# Patient Record
Sex: Female | Born: 1965 | Race: White | Hispanic: No | Smoking: Current every day smoker
Health system: Southern US, Community
[De-identification: ages and names within clinical notes are randomized; demographics above are authoritative.]

## PROBLEM LIST (undated history)

## (undated) DIAGNOSIS — J449 Chronic obstructive pulmonary disease, unspecified: Secondary | ICD-10-CM

## (undated) DIAGNOSIS — E119 Type 2 diabetes mellitus without complications: Secondary | ICD-10-CM

## (undated) DIAGNOSIS — F32A Depression, unspecified: Secondary | ICD-10-CM

## (undated) DIAGNOSIS — F329 Major depressive disorder, single episode, unspecified: Secondary | ICD-10-CM

## (undated) DIAGNOSIS — I10 Essential (primary) hypertension: Secondary | ICD-10-CM

## (undated) HISTORY — PX: BLADDER REPAIR: SHX76

---

## 2008-10-14 ENCOUNTER — Emergency Department (HOSPITAL_COMMUNITY): Admission: EM | Admit: 2008-10-14 | Discharge: 2008-10-14 | Payer: Self-pay | Admitting: Emergency Medicine

## 2009-07-13 ENCOUNTER — Encounter: Payer: Self-pay | Admitting: Emergency Medicine

## 2009-07-14 ENCOUNTER — Inpatient Hospital Stay (HOSPITAL_COMMUNITY): Admission: EM | Admit: 2009-07-14 | Discharge: 2009-07-26 | Payer: Self-pay | Admitting: Pulmonary Disease

## 2009-07-14 ENCOUNTER — Ambulatory Visit: Payer: Self-pay | Admitting: Internal Medicine

## 2009-07-26 ENCOUNTER — Inpatient Hospital Stay (HOSPITAL_COMMUNITY): Admission: RE | Admit: 2009-07-26 | Discharge: 2009-08-02 | Payer: Self-pay | Admitting: Psychiatry

## 2009-07-26 ENCOUNTER — Ambulatory Visit: Payer: Self-pay | Admitting: Psychiatry

## 2009-09-08 ENCOUNTER — Inpatient Hospital Stay: Payer: Self-pay | Admitting: Psychiatry

## 2009-09-20 ENCOUNTER — Inpatient Hospital Stay: Payer: Self-pay | Admitting: Internal Medicine

## 2009-09-20 ENCOUNTER — Ambulatory Visit: Payer: Self-pay | Admitting: Internal Medicine

## 2009-09-20 ENCOUNTER — Inpatient Hospital Stay: Payer: Self-pay | Admitting: Psychiatry

## 2010-02-23 ENCOUNTER — Ambulatory Visit (HOSPITAL_COMMUNITY): Admission: RE | Admit: 2010-02-23 | Discharge: 2010-02-23 | Payer: Self-pay | Admitting: Family Medicine

## 2010-03-14 ENCOUNTER — Ambulatory Visit (HOSPITAL_COMMUNITY): Admission: RE | Admit: 2010-03-14 | Discharge: 2010-03-14 | Payer: Self-pay | Admitting: Family Medicine

## 2010-06-28 IMAGING — CT CT ANGIO CHEST
2 of 6 series · 19 of 36 positions shown · IV contrast (omnipaque)
Comparison: Chest radiography same day

CLINICAL DATA: ELEVATED D-DIMER.  TACHYCARDIA.  SHORT OF BREATH.

CT ANGIOGRAPHY CHEST WITH CONTRAST
TECHNIQUE: Multidetector CT imaging of the chest was performed
using the standard protocol during bolus administration of
intravenous contrast. Multiplanar CT image reconstructions
including MIPs were obtained to evaluate the vascular anatomy.
Contrast: 100 ml Omnipaque 350

[Series 8: pulm embolism 1.0 b25f thins · axial · 0.62mm/px · z∈[-279,-48]mm · 18 of 257 slices shown]
[im 13/257  lung]
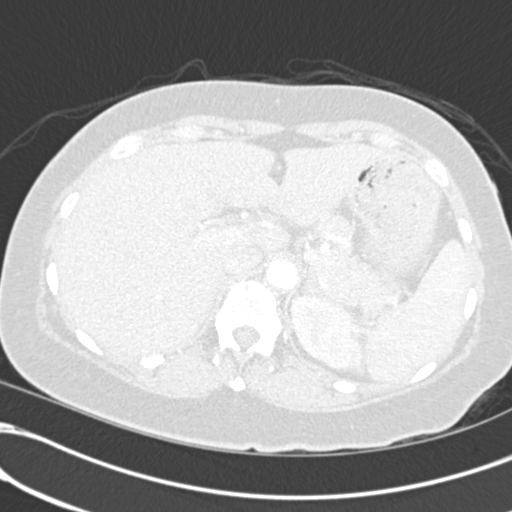
[im 26/257  mediastinal]
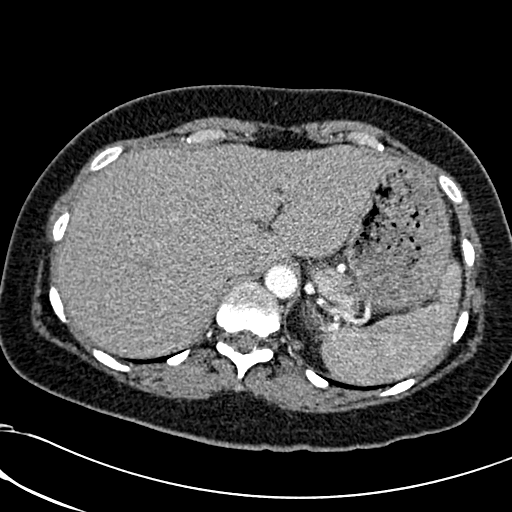
[im 39/257  lung]
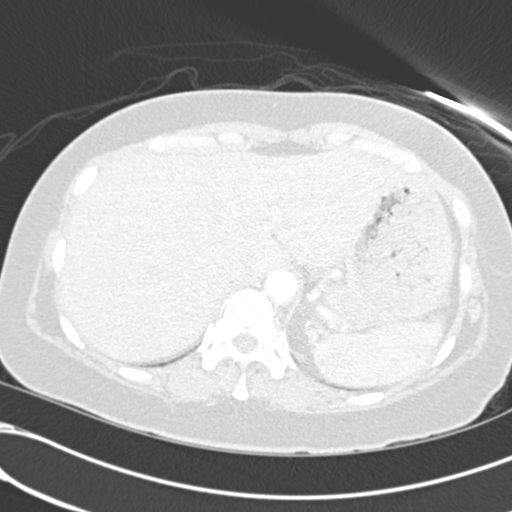
[im 52/257  mediastinal]
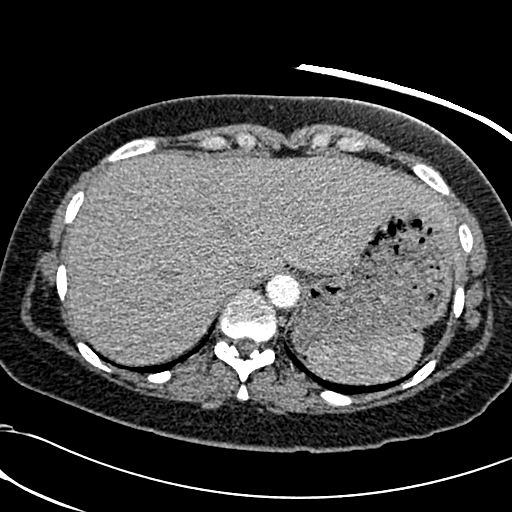
[im 65/257  lung]
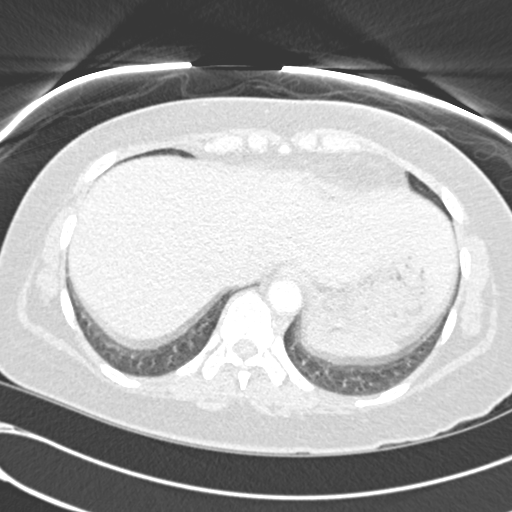
[im 77/257  mediastinal]
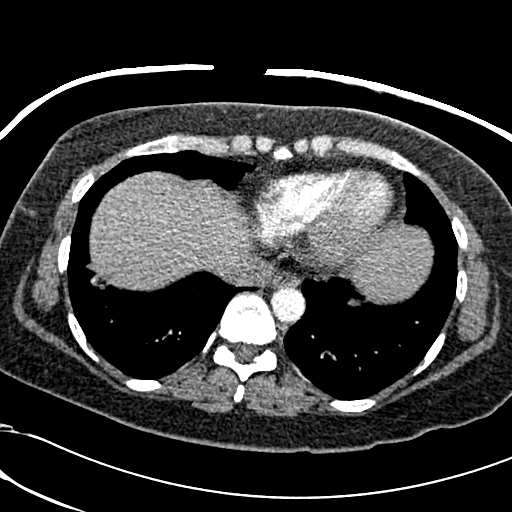
[im 90/257  lung]
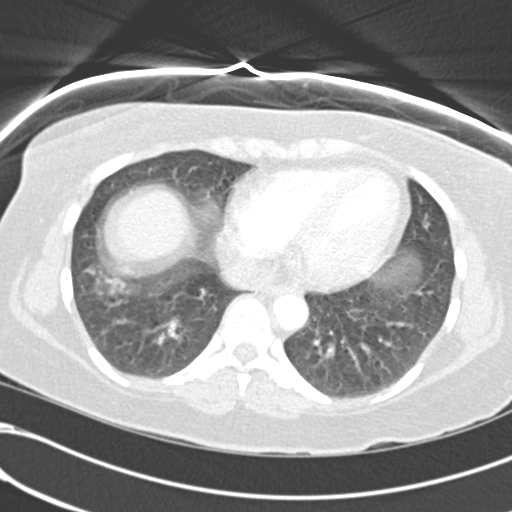
[im 103/257  mediastinal]
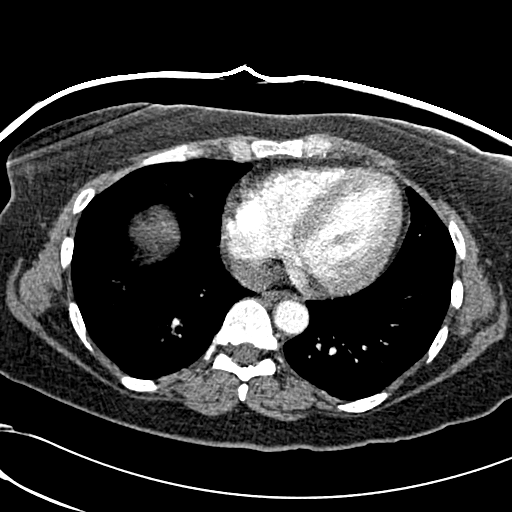
[im 116/257  lung]
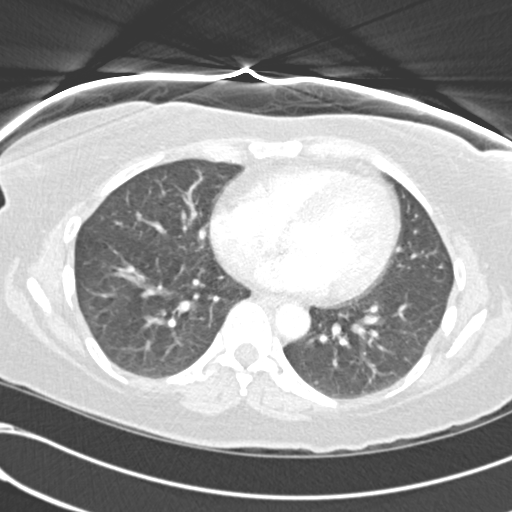
[im 141/257  mediastinal]
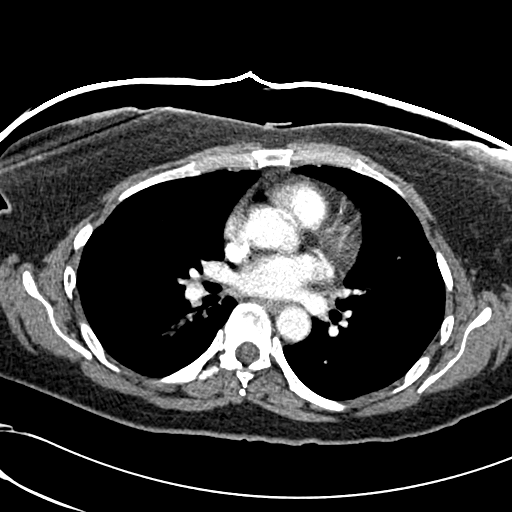
[im 154/257  lung]
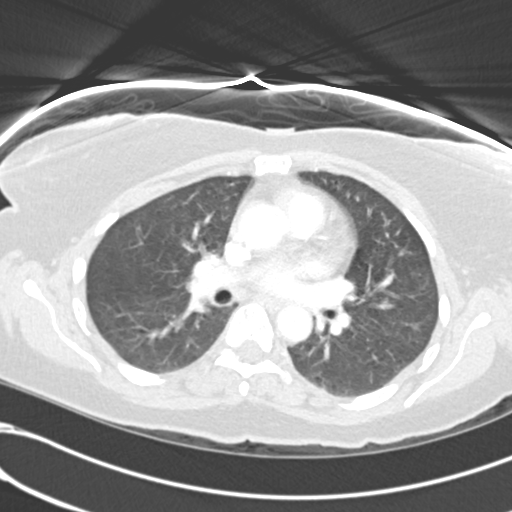
[im 167/257  mediastinal]
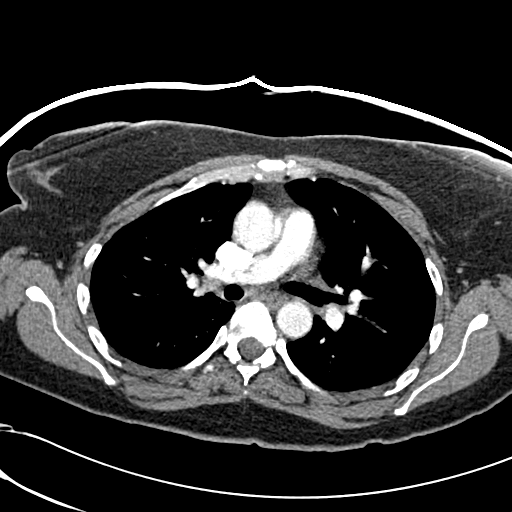
[im 180/257  lung]
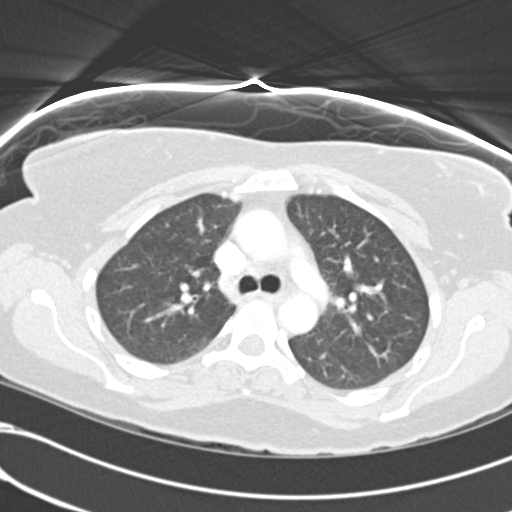
[im 193/257  mediastinal]
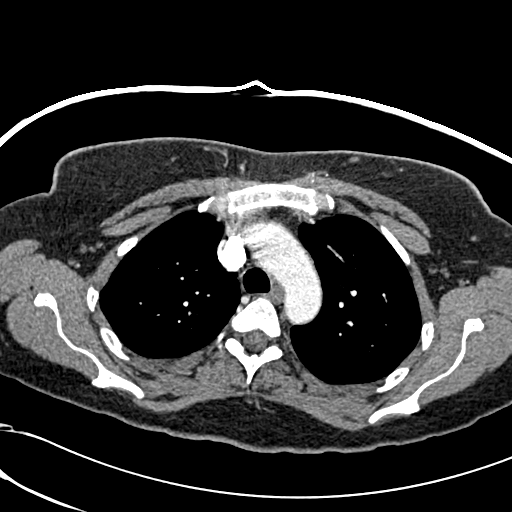
[im 205/257  lung]
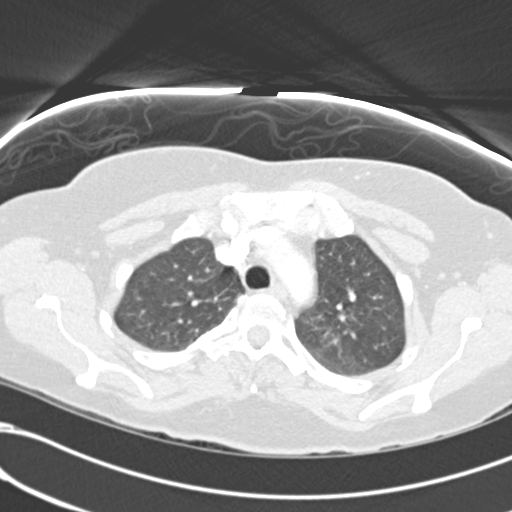
[im 218/257  mediastinal]
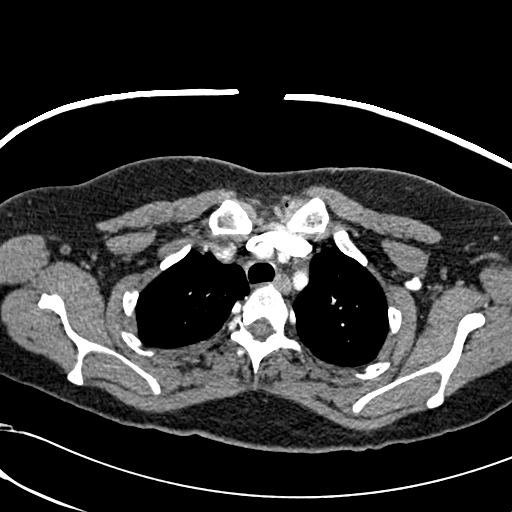
[im 231/257  lung]
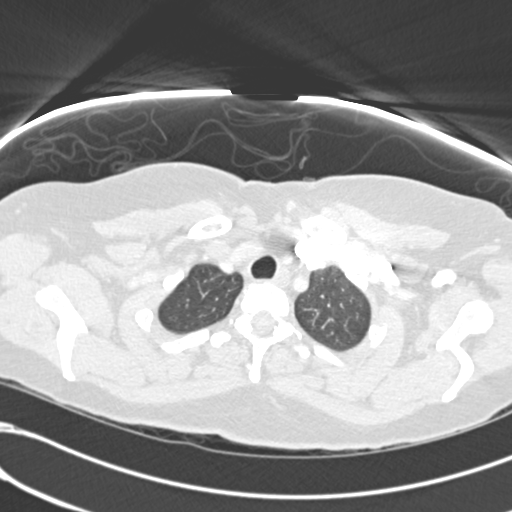
[im 244/257  mediastinal]
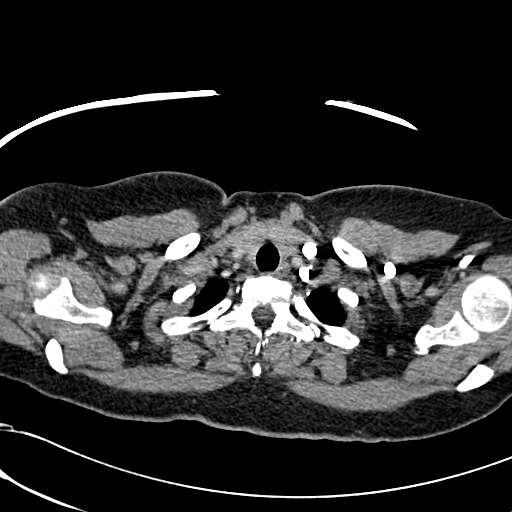

[Series 602: cor · coronal · 0.62mm/px · 1 of 91 slices shown]
[im 46/91  mediastinal]
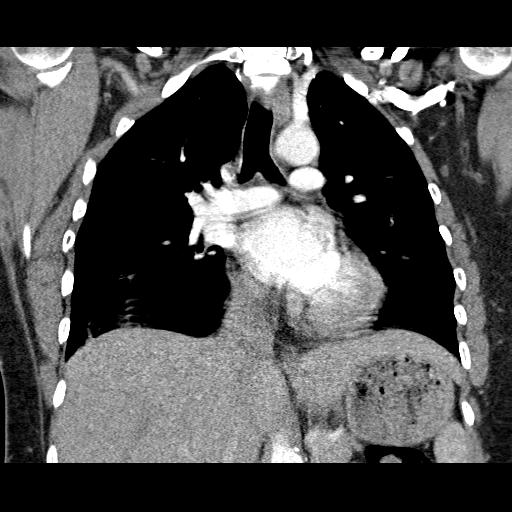

[19 of 36 positions shown; findings below may reference images not displayed]

FINDINGS: Pulmonary arterial opacification is good.  The
examination does suffer from some breathing motion.  The pulmonary
arterial tree does not show any emboli.  There is no aortic
pathology.  The patient does have some coronary artery
calcification evident on this non dedicated exam.  There is no
pleural fluid.  There is mild patchy infiltrate at the right lung
base that could represent early pneumonia.  There is no bony
abnormality of significance.

Review of the MIP images confirms the above findings.
IMPRESSION: Study degraded somewhat by breathing motion.  I believe this is
sufficiently diagnostic.

No sign of pulmonary emboli.

Coronary artery calcification.

Mild patchy infiltrate at the right lung base suggesting early
pneumonia.

## 2010-09-19 ENCOUNTER — Ambulatory Visit (HOSPITAL_COMMUNITY): Admission: RE | Admit: 2010-09-19 | Discharge: 2010-09-19 | Payer: Self-pay | Admitting: Family Medicine

## 2011-01-19 ENCOUNTER — Other Ambulatory Visit (HOSPITAL_COMMUNITY): Payer: Self-pay | Admitting: *Deleted

## 2011-01-19 DIAGNOSIS — Z09 Encounter for follow-up examination after completed treatment for conditions other than malignant neoplasm: Secondary | ICD-10-CM

## 2011-03-27 ENCOUNTER — Ambulatory Visit (HOSPITAL_COMMUNITY)
Admission: RE | Admit: 2011-03-27 | Discharge: 2011-03-27 | Disposition: A | Discharge status: Home / Self Care | Payer: Medicaid Other | Source: Ambulatory Visit | Attending: Family Medicine | Admitting: Family Medicine

## 2011-03-27 ENCOUNTER — Ambulatory Visit (HOSPITAL_COMMUNITY): Payer: Medicaid Other

## 2011-03-27 DIAGNOSIS — R928 Other abnormal and inconclusive findings on diagnostic imaging of breast: Secondary | ICD-10-CM | POA: Insufficient documentation

## 2011-03-27 DIAGNOSIS — Z09 Encounter for follow-up examination after completed treatment for conditions other than malignant neoplasm: Secondary | ICD-10-CM

## 2011-04-07 LAB — VITAMIN B12: Vitamin B-12: 201 pg/mL — ABNORMAL LOW (ref 211–911)

## 2011-04-07 LAB — COMPREHENSIVE METABOLIC PANEL
ALT: 106 U/L — ABNORMAL HIGH (ref 0–35)
ALT: 131 U/L — ABNORMAL HIGH (ref 0–35)
ALT: 140 U/L — ABNORMAL HIGH (ref 0–35)
ALT: 160 U/L — ABNORMAL HIGH (ref 0–35)
ALT: 80 U/L — ABNORMAL HIGH (ref 0–35)
AST: 47 U/L — ABNORMAL HIGH (ref 0–37)
AST: 496 U/L — ABNORMAL HIGH (ref 0–37)
AST: 509 U/L — ABNORMAL HIGH (ref 0–37)
AST: 41 U/L — ABNORMAL HIGH (ref 0–37)
Albumin: 3 g/dL — ABNORMAL LOW (ref 3.5–5.2)
Albumin: 3 g/dL — ABNORMAL LOW (ref 3.5–5.2)
Alkaline Phosphatase: 49 U/L (ref 39–117)
Alkaline Phosphatase: 61 U/L (ref 39–117)
Alkaline Phosphatase: 62 U/L (ref 39–117)
Alkaline Phosphatase: 65 U/L (ref 39–117)
Alkaline Phosphatase: 68 U/L (ref 39–117)
BUN: 3 mg/dL — ABNORMAL LOW (ref 6–23)
BUN: 5 mg/dL — ABNORMAL LOW (ref 6–23)
BUN: 6 mg/dL (ref 6–23)
BUN: <1 mg/dL — ABNORMAL LOW (ref 6–23)
BUN: 2 mg/dL — ABNORMAL LOW (ref 6–23)
CO2: 20 mEq/L (ref 19–32)
CO2: 25 mEq/L (ref 19–32)
CO2: 26 mEq/L (ref 19–32)
CO2: 26 mEq/L (ref 19–32)
CO2: 26 mEq/L (ref 19–32)
CO2: 28 mEq/L (ref 19–32)
CO2: 20 mEq/L (ref 19–32)
Calcium: 8.7 mg/dL (ref 8.4–10.5)
Calcium: 8.7 mg/dL (ref 8.4–10.5)
Calcium: 8.8 mg/dL (ref 8.4–10.5)
Calcium: 9.2 mg/dL (ref 8.4–10.5)
Calcium: 8.3 mg/dL — ABNORMAL LOW (ref 8.4–10.5)
Chloride: 105 mEq/L (ref 96–112)
Chloride: 106 mEq/L (ref 96–112)
Chloride: 108 mEq/L (ref 96–112)
Chloride: 108 mEq/L (ref 96–112)
Chloride: 109 mEq/L (ref 96–112)
Creatinine, Ser: 0.53 mg/dL (ref 0.4–1.2)
Creatinine, Ser: 0.77 mg/dL (ref 0.4–1.2)
Creatinine, Ser: 0.71 mg/dL (ref 0.4–1.2)
GFR calc Af Amer: >60 mL/min (ref >60)
GFR calc Af Amer: >60 mL/min (ref >60)
GFR calc Af Amer: >60 mL/min (ref >60)
GFR calc Af Amer: >60 mL/min (ref >60)
GFR calc non Af Amer: >60 mL/min (ref >60)
GFR calc non Af Amer: >60 mL/min (ref >60)
GFR calc non Af Amer: >60 mL/min (ref >60)
GFR calc non Af Amer: >60 mL/min (ref >60)
GFR calc non Af Amer: >60 mL/min (ref >60)
GFR calc non Af Amer: >60 mL/min (ref >60)
GFR calc non Af Amer: >60 mL/min (ref >60)
Glucose, Bld: 123 mg/dL — ABNORMAL HIGH (ref 70–99)
Glucose, Bld: 124 mg/dL — ABNORMAL HIGH (ref 70–99)
Glucose, Bld: 125 mg/dL — ABNORMAL HIGH (ref 70–99)
Glucose, Bld: 157 mg/dL — ABNORMAL HIGH (ref 70–99)
Glucose, Bld: 180 mg/dL — ABNORMAL HIGH (ref 70–99)
Glucose, Bld: 94 mg/dL (ref 70–99)
Potassium: 3 mEq/L — ABNORMAL LOW (ref 3.5–5.1)
Potassium: 3.6 mEq/L (ref 3.5–5.1)
Potassium: 3.7 mEq/L (ref 3.5–5.1)
Potassium: 3.9 mEq/L (ref 3.5–5.1)
Potassium: 4 mEq/L (ref 3.5–5.1)
Potassium: 4.1 mEq/L (ref 3.5–5.1)
Sodium: 137 mEq/L (ref 135–145)
Sodium: 137 mEq/L (ref 135–145)
Sodium: 139 mEq/L (ref 135–145)
Sodium: 140 mEq/L (ref 135–145)
Sodium: 142 mEq/L (ref 135–145)
Total Bilirubin: 0.3 mg/dL (ref 0.3–1.2)
Total Bilirubin: 0.4 mg/dL (ref 0.3–1.2)
Total Bilirubin: 0.4 mg/dL (ref 0.3–1.2)
Total Bilirubin: 0.5 mg/dL (ref 0.3–1.2)
Total Bilirubin: 0.7 mg/dL (ref 0.3–1.2)
Total Protein: 5.7 g/dL — ABNORMAL LOW (ref 6.0–8.3)
Total Protein: 6.2 g/dL (ref 6.0–8.3)
Total Protein: 6.5 g/dL (ref 6.0–8.3)

## 2011-04-07 LAB — LACTIC ACID, PLASMA
Lactic Acid, Venous: 2.6 mmol/L — ABNORMAL HIGH (ref 0.5–2.2)
Lactic Acid, Venous: 4.3 mmol/L — ABNORMAL HIGH (ref 0.5–2.2)
Lactic Acid, Venous: 5.9 mmol/L — ABNORMAL HIGH (ref 0.5–2.2)

## 2011-04-07 LAB — URINALYSIS, ROUTINE W REFLEX MICROSCOPIC
Bilirubin Urine: NEGATIVE
Glucose, UA: NEGATIVE mg/dL
Ketones, ur: NEGATIVE mg/dL
Leukocytes, UA: NEGATIVE
Nitrite: NEGATIVE
Nitrite: NEGATIVE
Specific Gravity, Urine: 1.003 — ABNORMAL LOW (ref 1.005–1.030)
Specific Gravity, Urine: >1.03 — ABNORMAL HIGH (ref 1.005–1.030)
Urobilinogen, UA: 0.2 mg/dL (ref 0.0–1.0)
Urobilinogen, UA: 0.2 mg/dL (ref 0.0–1.0)
pH: 6 (ref 5.0–8.0)
pH: 5 (ref 5.0–8.0)

## 2011-04-07 LAB — DIFFERENTIAL
Eosinophils Absolute: 0.1 10*3/uL (ref 0.0–0.7)
Eosinophils Absolute: 0 10*3/uL (ref 0.0–0.7)
Eosinophils Relative: 0 % (ref 0–5)
Lymphocytes Relative: 7 % — ABNORMAL LOW (ref 12–46)
Lymphs Abs: 1 10*3/uL (ref 0.7–4.0)
Lymphs Abs: 0.6 10*3/uL — ABNORMAL LOW (ref 0.7–4.0)
Monocytes Relative: 6 % (ref 3–12)
Monocytes Relative: 6 % (ref 3–12)
Neutro Abs: 5.2 10*3/uL (ref 1.7–7.7)
Neutrophils Relative %: 77 % (ref 43–77)

## 2011-04-07 LAB — BLOOD GAS, ARTERIAL
Acid-base deficit: 6.7 mmol/L — ABNORMAL HIGH (ref 0.0–2.0)
Bicarbonate: 17.3 mEq/L — ABNORMAL LOW (ref 20.0–24.0)
FIO2: 70 %
O2 Saturation: 98.7 %
O2 Saturation: 99.7 %
Patient temperature: 98.6
TCO2: 19.6 mmol/L (ref 0–100)
pCO2 arterial: 28.7 mmHg — ABNORMAL LOW (ref 35.0–45.0)
pH, Arterial: 7.48 — ABNORMAL HIGH (ref 7.350–7.400)
pO2, Arterial: 302 mmHg — ABNORMAL HIGH (ref 80.0–100.0)

## 2011-04-07 LAB — BASIC METABOLIC PANEL
BUN: <1 mg/dL — ABNORMAL LOW (ref 6–23)
CO2: 20 mEq/L (ref 19–32)
CO2: 23 mEq/L (ref 19–32)
Chloride: 108 mEq/L (ref 96–112)
Chloride: 112 mEq/L (ref 96–112)
Chloride: 112 mEq/L (ref 96–112)
Creatinine, Ser: 0.52 mg/dL (ref 0.4–1.2)
GFR calc Af Amer: >60 mL/min (ref >60)
GFR calc Af Amer: >60 mL/min (ref >60)
GFR calc non Af Amer: >60 mL/min (ref >60)
Glucose, Bld: 104 mg/dL — ABNORMAL HIGH (ref 70–99)
Potassium: 3.2 mEq/L — ABNORMAL LOW (ref 3.5–5.1)
Potassium: 3.5 mEq/L (ref 3.5–5.1)
Sodium: 141 mEq/L (ref 135–145)

## 2011-04-07 LAB — CBC
HCT: 31.7 % — ABNORMAL LOW (ref 36.0–46.0)
HCT: 36.3 % (ref 36.0–46.0)
HCT: 38.1 % (ref 36.0–46.0)
HCT: 29.9 % — ABNORMAL LOW (ref 36.0–46.0)
Hemoglobin: 10.8 g/dL — ABNORMAL LOW (ref 12.0–15.0)
Hemoglobin: 11 g/dL — ABNORMAL LOW (ref 12.0–15.0)
MCHC: 33.8 g/dL (ref 30.0–36.0)
MCHC: 34.1 g/dL (ref 30.0–36.0)
MCV: 85 fL (ref 78.0–100.0)
MCV: 85.4 fL (ref 78.0–100.0)
MCV: 82.9 fL (ref 78.0–100.0)
Platelets: 242 10*3/uL (ref 150–400)
Platelets: 289 10*3/uL (ref 150–400)
Platelets: 243 10*3/uL (ref 150–400)
RBC: 3.7 MIL/uL — ABNORMAL LOW (ref 3.87–5.11)
RBC: 3.61 MIL/uL — ABNORMAL LOW (ref 3.87–5.11)
RDW: 14.3 % (ref 11.5–15.5)
RDW: 14.6 % (ref 11.5–15.5)
RDW: 14.6 % (ref 11.5–15.5)
WBC: 9.6 10*3/uL (ref 4.0–10.5)
WBC: 9.4 10*3/uL (ref 4.0–10.5)

## 2011-04-07 LAB — PROTIME-INR
INR: 1.1 (ref 0.00–1.49)
INR: 1 (ref 0.00–1.49)
Prothrombin Time: 14.3 seconds (ref 11.6–15.2)
Prothrombin Time: 13.1 seconds (ref 11.6–15.2)
Prothrombin Time: 13.3 seconds (ref 11.6–15.2)

## 2011-04-07 LAB — URINE MICROSCOPIC-ADD ON

## 2011-04-07 LAB — TSH: TSH: 0.136 u[IU]/mL — ABNORMAL LOW (ref 0.350–4.500)

## 2011-04-07 LAB — AMMONIA: Ammonia: 33 umol/L (ref 11–35)

## 2011-04-07 LAB — OSMOLALITY, URINE: Osmolality, Ur: 544 mOsm/kg (ref 390–1090)

## 2011-04-07 LAB — HEPATITIS PANEL, ACUTE
Hep A IgM: NEGATIVE
Hep B C IgM: NEGATIVE

## 2011-04-07 LAB — RAPID URINE DRUG SCREEN, HOSP PERFORMED: Tetrahydrocannabinol: NOT DETECTED

## 2011-04-07 LAB — CULTURE, BLOOD (ROUTINE X 2): Culture: NO GROWTH

## 2011-04-07 LAB — RPR: RPR Ser Ql: NONREACTIVE

## 2011-04-07 LAB — MAGNESIUM
Magnesium: 2 mg/dL (ref 1.5–2.5)
Magnesium: 2.1 mg/dL (ref 1.5–2.5)
Magnesium: 2.1 mg/dL (ref 1.5–2.5)

## 2011-04-07 LAB — PHOSPHORUS
Phosphorus: 3.4 mg/dL (ref 2.3–4.6)
Phosphorus: 5 mg/dL — ABNORMAL HIGH (ref 2.3–4.6)
Phosphorus: <1 mg/dL — CL (ref 2.3–4.6)

## 2011-04-07 LAB — CORTISOL: Cortisol, Plasma: 39.1 ug/dL

## 2011-04-07 LAB — HEPATIC FUNCTION PANEL
Bilirubin, Direct: 0.2 mg/dL (ref 0.0–0.3)
Indirect Bilirubin: 0.5 mg/dL (ref 0.3–0.9)
Total Bilirubin: 0.7 mg/dL (ref 0.3–1.2)

## 2011-04-07 LAB — ACETAMINOPHEN LEVEL
Acetaminophen (Tylenol), Serum: <10 ug/mL — ABNORMAL LOW (ref 10–30)
Acetaminophen (Tylenol), Serum: 10.7 ug/mL (ref 10–30)
Acetaminophen (Tylenol), Serum: 14.7 ug/mL (ref 10–30)

## 2011-04-07 LAB — PREGNANCY, URINE: Preg Test, Ur: NEGATIVE

## 2011-04-07 LAB — T4, FREE: Free T4: 1.31 ng/dL (ref 0.80–1.80)

## 2011-04-07 LAB — URINE CULTURE

## 2011-04-07 LAB — D-DIMER, QUANTITATIVE: D-Dimer, Quant: 1.7 ug/mL-FEU — ABNORMAL HIGH (ref 0.00–0.48)

## 2011-04-07 LAB — CALCIUM, IONIZED: Calcium, Ion: 1.26 mmol/L (ref 1.12–1.32)

## 2011-05-14 NOTE — Consult Note (Signed)
[name], [name]              ACCOUNT NO.:  [id number]   MEDICAL RECORD NO.:  [id number]          PATIENT TYPE:  INP   LOCATION:  3107                         FACILITY:  [facility]   PHYSICIAN:  Antonietta Breach, M.D.  DATE OF BIRTH:  12-09-1966   DATE OF CONSULTATION:  07/14/2009  DATE OF DISCHARGE:                                 CONSULTATION   ADDENDUM   Concur with further evaluating her low TSH.      Antonietta Breach, M.D.  Electronically Signed     JW/MEDQ  D:  07/15/2009  T:  07/15/2009  Job:  [id number]

## 2011-05-14 NOTE — Discharge Summary (Signed)
[name], [name]              ACCOUNT NO.:  [id number]   MEDICAL RECORD NO.:  [id number]          PATIENT TYPE:  INP   LOCATION:  3017                         FACILITY:  [facility]   PHYSICIAN:  Altha Harm, MDDATE OF BIRTH:  02-01-1966   DATE OF ADMISSION:  07/14/2009  DATE OF DISCHARGE:  07/25/2009                               DISCHARGE SUMMARY   DISCHARGE DISPOSITION:  Inpatient Behavioral Health.   DISCHARGE DIAGNOSES:  1. Metabolic encephalopathy.  2. Hyponatremia.  3. Seizures.  4. Possible hypertensive encephalopathy.  5. Bipolar disorder.  6. Suicidal ideation.  7. Transient transaminitis now resolved.  8. Escherichia coli urinary tract infection.  9. Sinus tachycardia.  10.Metabolic acidosis resolved.  11.Acetaminophen toxicity.  12.Schizophrenia with suicidal ideations.  13.Low TSH.  14.Hypokalemia.   DISCHARGE MEDICATIONS:  1. Norvasc 5 mg p.o. daily.  2. Abilify 5 mg p.o. daily.  3. Multivitamin 1 tablet p.o. daily.  4. Metoprolol 50 mg p.o. b.i.d..   CONSULTANTS:  1. Dr. Jeanie Sewer, Psychiatry.  2. [facility].   DIAGNOSTIC STUDIES:  1. CT chest, one-view which shows no active disease.  2. CT head without contrast which shows a very faint hypodensity in      the occipital white matter as suggested.  In the setting of seizure      and hypertension, this could reflect posterior reversible      encephalopathy /hypertensive encephalopathy.  Mild chronic sphenoid      sinusitis.  3. Repeat CT of the head without contrast done on the 18th which shows      no evidence of acute intracranial abnormality.  Impression:  The      occipital white matter hypodensities identified on the previous      study are not well-visualized today.  4. Abdominal ultrasound done on the 19th which shows negative      abdominal ultrasound.  5. MRI of the brain with and without contrast done on the 19th which      shows no acute brain pathology evident.  If the patient  did have      posterior reversible encephalopathy, it has resolved.  There is      some chronic appearing white matter foci in the frontal lobes.  6. Two view chest x-ray done on 20th which shows no acute      cardiopulmonary process.  7. CT angiogram of chest done on the 24th which shows no sign of      pulmonary embolus.  Mild patchy infiltrate at the right lung base      suggesting early pneumonia.    Primary care physician is unassigned.   ALLERGIES:  PENICILLIN.   Code status:  Full code.   CHIEF COMPLAINT:  Unresponsive but protecting airway status post  seizure.   HISTORY OF PRESENT ILLNESS:  Please refer to the report listed  consultation by Dr. Derenda Mis on July 15 for details of the HPI.  Please note that the patient was seen at [facility] by Dr.  Ladona Ridgel who then had her transferred to the critical care medicine  service here at [facility].  But in short, this is a  lady with  a history of schizophrenia who had not seen a physician for many years  who presented to the hospital on the morning of admission complaining of  not feeling well and complaining of headache.  She admits to taking  Tylenol early  in the morning but not taking any medications.  The  patient was getting into the car to be transported to the hospital when  she developed the onset of acute tonic-clonic seizures lasting several  minutes.   HOSPITAL COURSE:  1. Metabolic encephalopathy with hyponatremia.  The patient upon      evaluation is found to be hyponatremic with sodium of 116.  This      was felt to be the etiology of her seizures.  The patient was      treated with IV fluids and given corrective care and her sodium      resolved.  The patient has had no further seizure activity June      hospitalization.  2. Encephalopathy.  The patient had an encephalopathy which was likely      multifocal including metabolic owing to a sodium of 528,      hypertensive, psychiatric.   The patient was treated for      hypertension; in addition her sodium was corrected and her      encephalopathy resolved.  3. Acetaminophen toxicity.  The patient had taken acetaminophen and 4      hours post ingestion her acetaminophen levels were 15.  She was      treated with acetadote, a bolus dose and the acetaminophen level      returned to normal.  4. E-coli urinary tract infection.  The patient had E. coli urinary      tract infection sensitive to ciprofloxacin.  She was fully treated      for this.  5. Possible aspiration pneumonia.  The patient did not have any      clinical signs but on CT angiogram was diagnosed with pneumonia.      The patient had been on ciprofloxacin for UTI and this was also      used to treat her pneumonia.  Presently the patient is without any      symptoms, breathing comfortably without any problems.  6. Schizophrenia, suicidal ideations.  Apparently the patient has a      history of schizophrenia and had suicidal ideations upon arrival to      the hospital.  She continues to have suicidal ideations and      continues to hear voices in her head.  Dr. Jeanie Sewer saw her in      consultation and has recommended the patient be seen in inpatient      Behavioral Health.  The patient presently is on Abilify 5 mg p.o.      daily.  7. Transaminitis.  The patient has transient transaminitis and this      has resolved with supportive care.  8. Hypertension.  The patient as noted above was hypertensive upon      arrival and likely with a  hypertensive encephalopathy.  The      patient is currently on Norvasc and metoprolol 50 mg p.o. b.i.d..   DIETARY RESTRICTIONS:  The patient should be on a 2 gram sodium diet.   PHYSICAL RESTRICTIONS:  None.   FOLLOW UP:  The patient is to be transferred to inpatient Behavioral  Health.  She will  likely need to find a primary care physician upon  discharge to follow her for her hypertension.  Further instructions   regarding psychiatric follow-up will be given at the time of discharge  from inpatient Behavioral Health.   TOTAL DISCHARGE TIME:  42 minutes.      Altha Harm, MD  Electronically Signed     MAM/MEDQ  D:  07/25/2009  T:  07/25/2009  Job:  [id number]

## 2011-05-14 NOTE — Consult Note (Signed)
[name], GRATER NO.:  [id number]   MEDICAL RECORD NO.:  [id number]          PATIENT TYPE:  INP   LOCATION:  3107                         FACILITY:  [facility]   PHYSICIAN:  Antonietta Breach, M.D.  DATE OF BIRTH:  1966/03/13   DATE OF CONSULTATION:  07/14/2009  DATE OF DISCHARGE:                                 CONSULTATION   REQUESTING PHYSICIAN:  [facility].   REASON FOR CONSULTATION:  Psychosis.   HISTORY OF PRESENT ILLNESS:  [name] is a 45 year old female  admitted to the [facility] on July 13, 2009, due to a convulsion and  hyponatremia.  She had initial clouding of consciousness and has now  regained a clear sensorium.  She persists with inappropriate laughter.  She also has a pattern of going mute except that she will use murmuring  in an inflection as if she is making a statement cognitively in her mind  without verbalizing the words.  She describes a chip that is controlling  and has been placed in her body.  She describes thought broadcasting and  paranoia regarding other people in the environment.   Her judgment is clearly impaired.  She states that she has been having  suicidal thoughts.  However, she is vague about when they have been  occurring.   In review of the admitting physician's report the patient was a poor  historian and her brother provided history.  He had stated to Dr. Ladona Ridgel  that the patient had been complaining of not feeling well on the morning  before her convulsion.  She had told her brother not to speak but to rub  her hand from her head down across her chest and abdomen.  She then  turned around and walked away from her brother.  It is suspected that  she went to her neighbor's house and then she made a call to her  brother.  At that time she stated that she was not feeling well and was  having headache.  When her brother went to retrieve her he found her  walking home.  She told her brother that she  had taken some Tylenol in  the morning.  When her brother was trying to take her to the hospital  she developed a tonic-clonic convulsion that lasted several minutes  after which she was unresponsive.  This was followed by some  arousability but then returned back to obtundation and unresponsiveness.   In the emergency room the patient's eyes were disconjugate.  She had  salivation following flowing from her mouth and was not arousable to  tactile or verbal stimuli.  Her Tylenol level was 15 and she was given  Mucomyst.  She awoke from her deep obtundation and became combative.  She also was showing a hypertension with her systolic blood pressure in  the 190s along with tachycardia.  Since that time she has developed a  clear sensorium with the presentation mentioned above.  Please see the  mental status exam.   Possible contributing factors involve self medicating with herbal  medicine.  The brother has  mentioned that she has been using alternative  herbal treatments including colonic cleansers.  Her sodium was 116 and  she did require hypertonic saline upon presentation.   PAST PSYCHIATRIC HISTORY:  [name] does acknowledge that she has had  periods in the past with suicidal ideation.  She denies ever attempting.  She denies psychiatric hospitalization.  However, she acknowledges that  she has seen a psychiatrist at a clinic.  She has been placed on Abilify  in the past for having similar psychotic symptoms as she is experiencing  now.  She denies any side effects from the Abilify and she states that  it did reduce the intensity of her psychotic experience.  It is unclear  how long she stayed on the Abilify.   FAMILY PSYCHIATRIC HISTORY:  None known.   SOCIAL HISTORY:  [name] is divorced.  She is unemployed, medically  disabled.  She lives in a house with her brother.  She does not use  alcohol or illegal drugs.   PAST MEDICAL HISTORY:  Status post convulsion,  hyponatremia.  She did  receive Mucomyst.  Her initial Tylenol level was 15.3 followed by  another one 1 hour later which was 14.7, at the third hour it was 10.7.   OTHER LABORATORY DATA:  Urine drug screen was positive for  benzodiazepines.  Alcohol negative.  HCG negative.  Ammonia normal.  INR  normal.  TSH was low at 0.136.  Hepatic function panel did show elevated  transaminases with SGOT 181, SGPT 39, magnesium normal.  Phosphorus was  low and then corrected.  Head CT on July 15 showed a very faint  hypodensity in the occipital white matter, it was suggested, it was  noted by the radiologist that in the setting of seizure and hypertension  this could reflect posterior reversible encephalopathy/hypertensive  encephalopathy.   MEDICATIONS:  The MAR is reviewed.  She is not currently on any  psychotropic medication other than Ativan 0.5 to 1 mg q.4 h p.r.n.   ALLERGIES:  PENICILLIN.   REVIEW OF SYSTEMS:  The patient can provide some of this.  The rest is  gleaned from the staff and the medical record.  Constitutional, head,  eyes, ears, nose, throat, mouth, neurologic, psychiatric,  cardiovascular, respiratory, gastrointestinal, genitourinary, skin,  musculoskeletal, hematologic, lymphatic, endocrine and metabolic all  unremarkable.   PHYSICAL EXAMINATION:  VITAL SIGNS:  Temperature 99.8, pulse 98,  respiratory rate 20, blood pressure 130/56.  GENERAL APPEARANCE:  [name] is a middle-aged female lying in a  supine position in her hospital bed with no abnormal involuntary  movements.  MENTAL STATUS EXAM:  [name] is alert.  Her eye contact is poor.  She looks toward the door and laughs often.  Her attention span is  mildly decreased.  Her affect involves inappropriate laughter.  She then  demonstrates an intermittent pattern of the murmuring described above in  the history of present illness.  Her mood involves intermittent  inappropriate laughter.  Her concentration is  mildly decreased.  She is  completely oriented to the year, month, day of the month, day of the  week, place and person.  Her memory function is intact to immediate,  recent and remote except for the blackout periods with the convulsions.  Her fund of knowledge and intelligence are grossly within normal limits.  Her speech involves normal articulation when she is not murmuring.  There is no dysarthria when she is not murmuring.  Thought process  involves  some illogia but is overall coherent.  Thought content paranoid  delusions, thought insertion, thought broadcasting.  She states that  many people are observing her and tracking her when this is clearly not  the case.  Her insight is partial in that she does recognize that  Abilify was able to reduce the unpleasant aspect of her experience.  Her  judgment is impaired.   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  Rule out schizophrenia undifferentiated type, chronic  with acute exacerbation.  Depressive disorder not otherwise specified.  She does give a history of suicidal thoughts.  The elevated  transaminases suggests that Tylenol overdosing may have been a liver  insult and that the Tylenol levels obtained were at the tail end of what  was a toxic level.  It may be that the colon cleansing system that she  was using resulted in a hyponatremia.  Her delirium has now resolved.  AXIS II:  Deferred.  AXIS III:  See past medical history.  AXIS IV:  Primary support group, general medical.  AXIS V:  20.   Ms. Stigall clearly is at risk for lethal self-neglect given her  psychosis.  Also it is likely that she has taken a Tylenol overdose in  the past week.  She also mentions that she has had suicidal thoughts but  there is vague history provided by the patient.   RECOMMENDATIONS:  Would admit her to an inpatient psychiatric unit once  she is medically cleared given her risk of lethal  self-neglect and  likely ongoing risk of suicide.  She does agree to start Abilify.  She  has insight that it helped reduce the intensity of her unpleasant  experience.  Would start it at 10 mg p.o. or IM q.1800 hours.  Would  admit to an inpatient psychiatric unit once medically cleared.  Would  provide low stimulation ego support to facilitate a therapeutic  alliance.  With the Abilify would monitor for stiffness or other  extrapyramidal side effects.      Antonietta Breach, M.D.  Electronically Signed     JW/MEDQ  D:  07/15/2009  T:  07/15/2009  Job:  [id number]

## 2011-05-14 NOTE — Procedures (Signed)
EEG NUMBER:  [id number].   HISTORY:  This is a 45 year old patient who is being evaluated for  seizure events.  The patient has a history of hyponatremia, psychosis,  and altered mental status.  The patient has hypertension.  The patient  is being evaluated for the above.  This is a routine EEG.  No skull  defects noted.   MEDICATIONS:  Protonix, Abilify, potassium, Norvasc, acetylcysteine,  Apresoline, Aldomet, and Ativan.   EEG CLASSIFICATION:  Essentially normal awake and drowsy.   DESCRIPTION OF THE RECORDING:  Background rhythm of this recording  consists of a fairly well-modulated medium amplitude alpha rhythm of 8-  Hz that is reactive to eye open and closure.  As the record progresses,  the patient appears initially to be in awaking state.  Photic  stimulation is performed resulting in an excellent and bilaterally  symmetric photic drive response.  Hyperventilation is also performed  resulting in minimal buildup of background activity with mild symmetric  slowing seen.  At times during the recording, the patient appears  entered the drowsy state with some generalized theta frequency slowing  seen, the patient never clearly entered stage II sleep at any time.  At  no time, during the recording there appear to be evidence of spike,  spike wave discharges, or evidence of focal slowing.  EKG monitor shows  no evidence of cardiac rhythm abnormalities with a heart rate of 102.   IMPRESSION:  This is a normal EEG recording in awake and drowsy state.  No evidence of ictal or interictal discharges were seen.      Marlan Palau, M.D.  Electronically Signed     BJY:NWGN  D:  07/17/2009 14:14:31  T:  07/18/2009 03:20:23  Job #:  [id number]

## 2011-05-14 NOTE — Consult Note (Signed)
NAMETIMEA, BREED              ACCOUNT NO.:  [id number]   MEDICAL RECORD NO.:  [id number]          PATIENT TYPE:  INP   LOCATION:  3017                         FACILITY:  [facility]   PHYSICIAN:  Antonietta Breach, M.D.  DATE OF BIRTH:  [birth year]   DATE OF CONSULTATION:  07/25/2009  DATE OF DISCHARGE:                                 CONSULTATION   PRIORITY INPATIENT CONSULTATION FOLLOWUP   [name] continues to have auditory hallucinations.  She continues  with impaired judgment; however, her insight is improved.  She is able  to recognize that she has a mental illness and needs further care.   She has been having intermittent suicidal ideation.  Her mood is  depressed.  She is crying as the undersigned is interviewing her.   MENTAL STATUS EXAM:  As above, [name] is alert.  Her thought  process is coherent.  Her affect is constricted.  Her mood is mildly  depressed.  Her judgment is impaired.  She is still having auditory  hallucinations.  She is now acknowledging that she took the overdose to  kill herself because of her severe mental anguish.  She is oriented to  all spheres, and her memory function is intact.   ASSESSMENT:   AXIS I:  293.82:  Psychotic disorder, not otherwise specified, with  hallucinations.   RECOMMENDATION:  1. Would admit to an inpatient psychiatric unit for further evaluation      and treatment.  2. Would continue the Abilify trial for anti-psychosis.   DISCUSSION:  [name] likely has a diagnosis of schizophrenia.  She  has resisted this diagnosis in the past as well as resisting having any  form of mental illness.   She is expressing some insight now that will help in her ongoing care.      Antonietta Breach, M.D.  Electronically Signed     JW/MEDQ  D:  07/25/2009  T:  07/25/2009  Job:  [id number]

## 2011-05-14 NOTE — Consult Note (Signed)
NAMEJANECE, [name]              ACCOUNT NO.:  [id number]   MEDICAL RECORD NO.:  [id number]          PATIENT TYPE:  EMS   LOCATION:  ED                            FACILITY:  [facility]   PHYSICIAN:  Melissa L. Ladona Ridgel, MD  DATE OF BIRTH:  25-Nov-1966   DATE OF CONSULTATION:  07/13/2009  DATE OF DISCHARGE:                                 CONSULTATION   REASON FOR CONSULTATION:  I was called to see a 45 year old female who  was described as unresponsive but protecting her airway status post  seizure.  Consultation was requested by Rhae Lerner. Margretta Ditty, M.D.   HISTORY OF PRESENT ILLNESS:  The patient's brother states that this  morning she was complaining of not feeling well and all that she would  tell him with regard to not feeling well was not to speak but to rub her  hand from her head down across her chest and abdomen.  She subsequently  turned around and walked away from him and went about her business for  the rest of the day.  She evidently wandered off to the neighbor's house  later in the afternoon and the patient's brother was called stating she  was not feeling well and was complaining of a headache.  The patient's  brother went to bring her back from the neighbor's house, found her  walking home.  The patient had evidently informed her brother that she  had taken some Tylenol earlier this morning, but had taken no other  medications in response to her current illness.  The patient's brother  attempted to put the patient in the car to transport her to the hospital  when she developed the onset of acute tonic clonic seizures lasting  several minutes and resulting in her being unresponsive afterwards.  He  states that when the EMS workers arrived that she did wake up a little  bit but then wake up a little bit but then subsequently back to a state  of unresponsiveness.  In the ER, the patient was found to be likely  having active seizures.  Eyes were disconjugate, saliva was free  flowing  from her mouth.  She was not arousable to tactile or verbal stimuli.  After several moments, the patient would respond to noxious stimuli only  by rousing slightly, moving her hands.  Blood glucose was 160, vital  signs were stable.  The patient maintained a light wakefulness with deep  sedation for greater than one hour with a stable signs but at risk for  airway protection.  I reviewed the patient's CT scan which showed a  hypodense area in the cerebellum worrisome for PRES.  The patient was  started on 3% hypertonic saline at the outset of the evaluation,  received 200 mL of that and Mucomyst was initiated because the patient's  Tylenol level was noted to be 15.  It was decided that the patient was  not protecting her airway adequately and she was to be intubated and  transferred to [facility] ICU for further care.  While awaiting intubation,  the patient became suddenly awake and combative  as well as hypertensive  with systolic blood pressure in the 190s and tachycardia in the 124  range.  She appeared unable to speak and remained with double swallowing  suggesting possible neurological issue.  We elected not to intubate her  and continued with plan to transfer to [facility] for further neurological  assessment.  Prior to discharge with [facility], the patient was 1 mg of  Ativan for her combativeness.   REVIEW OF SYSTEMS:  Unable to be obtained other than what the patient's  brother told me about her headache.   ALLERGIES:  PENICILLIN.   MEDICATIONS:  She takes no formalized medication.  According to her  brother, she has not see a doctor in years although our medical record  indicates she was seen in 2009 for a pneumonia.   PAST MEDICAL HISTORY:  Bipolar disease/schizophrenia.   Please note the patient does use herbal remedies and colonics.   FAMILY HISTORY:  Unknown.   SOCIAL HISTORY:  Unclear although old medical records indicate she does  not smoke, she does not drink.  She  lives in a separate part of the same  house that her brother lives in.   PHYSICAL EXAMINATION:  VITAL SIGNS:  Temperature 100, blood pressure  140/70, pulse 78, respiratory rate 16 to 20, saturation 99%.  Subsequently when the patient awoke, her blood pressure went up to  197/60s and heart rate of 124.  GENERAL:  Initial evaluation showed a 45 year old female with altered  level of consciousness not responsive even to noxious stimuli.  HEENT:  Her tympanic membranes were intact without any lesions.  Eyes:  Pupils equal, round and reactive to light but with disconjugate gaze.  Subsequently when she was awake they were equal, round, reactive with no  nystagmus and equal gaze.  Mucous membrane were visualized once when the  patient yawned, showed moistness without any oral lesions or lip  lesions.  NECK:  Supple.  I do not appreciate any jugular venous distention, no  lymphadenopathy, trachea was in the midline.  CHEST:  Clear to auscultation.  No rhonchi, rales or wheezes.  CARDIOVASCULAR:  Regular rate and rhythm, positive S1 and S2, no S3, S4,  murmurs, rubs or gallops.  ABDOMEN:  Soft, nontender, nondistended with positive bowel sounds.  EXTREMITIES:  No cyanosis, clubbing or edema.  NEUROLOGIC:  Initially, she was obtunded, would not rouse even to deep  sternal rub.  She subsequently became alert, was not oriented.  Cranial  nerves 2 through 12 appeared to be intact during her wakeful period.  She was able to move all four extremities with fairly good strength when  she was awake.  She was hyperreflexic bilaterally  but right was seen to  be greater than left.   LABORATORY DATA:  A Tylenol level of 15 upon arrival.  pH on ABG was  7.396 with PCO2 of 28.7, PO2 of 302, bicarb of 17.3, oxygen saturation  99.7%.  Urine drug screen was positive for benzodiazepines.  Otherwise,  it was negative.  Her urine had trace hemoglobin, trace protein,  specific gravity was greater than 1.030,  glucose was 250 mg, leukocyte  esterase was negative, urine nitrates were negative and she had rare  epithelial cells.  Her Tylenol level, as stated, was 15.3.  Sodium was  116, potassium 2.7, chloride 87, CO2 20, BUN 2, glucose was 160.  Creatinine was 0.71.  AST was slightly high at 41 with an ALT of 14,  total protein is 6, albumin  is 3.3 and calcium of 8.3, lactic acid was  5.9.  Alcohol was less than 5.  Urine pregnancy was negative.  Repeat  Tylenol was 14.7 and subsequently 10.7.  Her ammonia was 33.  White  count was 9.4 with hemoglobin of 10.6, hematocrit 29.9 and platelets of  243.   ASSESSMENT AND PLAN:  A 45 year old white female presents with seizure  of unclear etiology.  It appears that she was initially in a fairly  significant postictal state of coma likely with persistent intermittent  seizures.  The patient was found to be extremely hyponatremic at 116 and  was treated with 3% saline.  200 mL was administered.  The source of her  hyponatremia was unclear but the patient may have excessive fluid intake  secondary to her bipolar or schizophrenia.  She also uses colonic  cleansers and herbal remedies which may be contributing to her  electrolyte abnormalities.  Her head CT is worrisome for possible PRES.  I have asked for transfer to [facility] for further evaluation with MRI  and neurology.  I have also asked for the patient's brother to gather up  any of her medications and bring them to the hospital in the morning so  that we can view them.   I reviewed the case with neurology and with critical care medicine who  is the accepting physician for this patient.  Plan is to transfer the  patient for further evaluation at [facility].  Total time on this case  is approximately 2 hours, 120 minutes.      Melissa L. Ladona Ridgel, MD  Electronically Signed     MLT/MEDQ  D:  07/14/2009  T:  07/14/2009  Job:  [id number]

## 2011-05-17 NOTE — Discharge Summary (Signed)
[name] NO.:  [id number]   MEDICAL RECORD NO.:  [id number]          PATIENT TYPE:  IPS   LOCATION:  0406                          FACILITY:  [facility]   PHYSICIAN:  Anselm Jungling, MD  DATE OF BIRTH:  Aug 06, 1966   DATE OF ADMISSION:  07/25/2009  DATE OF DISCHARGE:  08/02/2009                               DISCHARGE SUMMARY   IDENTIFYING DATA AND REASON FOR ADMISSION:  This was an inpatient  psychiatric admission for [name], 45 year old unmarried, Caucasian  female, who presented with a history of psychotic disorder, most likely  schizophrenia, chronic type.  She stated, I need help getting my  medications straightened out.  Please refer to the admission note for  further details pertaining to the symptoms, circumstances and history  that led to her hospitalization.  She was given an initial Axis I  diagnosis of psychosis NOS.   MEDICAL AND LABORATORY:  The patient came to [place] with a history of  hypertension.  She was medically and physically assessed by the  psychiatric nurse practitioner.  She was continued on her usual  Lopressor 50 mg b.i.d. and Norvasc 5 mg daily.  There were no  significant medical issues during her hospital stay.  She was instructed  upon discharge to follow up with the [facility] Department,  with an appointment there on August 09, 2009 at 1 p.m.   HOSPITAL COURSE:  The patient was admitted to the adult inpatient  psychiatric service.  She presented as a well-nourished, normally-  developed female who was alert, fully oriented, pleasant, open to talk.  She stated, I was really depressed and took an overdose.  She reported  that this had happened about 2 weeks prior, and stated, I had a seizure  after that.  The history surrounding this was not clear.  Her affect  was somewhat sad and blunted.  Her thoughts were well-organized.  She  was absent any further suicidal ideation and verbalized a strong desire  for help.   She gave [place] permission to contact her siblings for further  support and family conference.   She was to treated with Abilify, which she reported had been helpful  before.  Her dose was initiated at 10 mg b.i.d., and then later in her  stay increased to 10 mg t.i.d. with good results.   She participated in therapeutic groups and activities and was generally  a very good participant.   On the third hospital day, there was a family session involving the  patient and her siblings.  They were very supportive.  Her treatment and  aftercare needs were discussed at length.   Over the course of her stay, her auditory hallucinations diminished  gradually.  She continued absent of any overt symptoms of psychosis,  however.   The patient indicated that her auditory hallucinations were  significantly diminished by the time of discharge.  She indicated that  she felt ready for discharge on the ninth hospital day.  She agreed to  the following aftercare plan.   AFTERCARE:  The patient was to follow up at  [facility]  with an appointment on August 09, 2009 at 8 a.m.   DISCHARGE MEDICATIONS:  1. Abilify 10 mg t.i.d.  2. Lopressor 50 mg b.i.d.  3. Norvasc 5 mg daily.   DISCHARGE DIAGNOSES:  AXIS I:  Schizophrenia, chronic undifferentiated  type, acute exacerbation, resolving.  AXIS II:  Deferred.  AXIS III:  Hypertension.  AXIS IV:  Stressors severe.  AXIS V:  GAF on discharge 55.      Anselm Jungling, MD  Electronically Signed     SPB/MEDQ  D:  08/03/2009  T:  08/03/2009  Job:  [id number]

## 2011-10-01 LAB — URINALYSIS, ROUTINE W REFLEX MICROSCOPIC
Bilirubin Urine: NEGATIVE
Glucose, UA: NEGATIVE
Hgb urine dipstick: NEGATIVE
Specific Gravity, Urine: <1.005 — ABNORMAL LOW

## 2011-10-01 LAB — DIFFERENTIAL
Lymphs Abs: 1.3
Monocytes Absolute: 0.7
Monocytes Relative: 5
Neutro Abs: 11.1 — ABNORMAL HIGH
Neutrophils Relative %: 85 — ABNORMAL HIGH

## 2011-10-01 LAB — BASIC METABOLIC PANEL
BUN: 8
CO2: 24
Calcium: 9.9
Creatinine, Ser: 0.68
GFR calc non Af Amer: >60
Glucose, Bld: 96

## 2011-10-01 LAB — CBC
MCHC: 33.8
Platelets: 254
RDW: 13.7

## 2011-10-01 LAB — T4, FREE: Free T4: 1.34

## 2013-07-15 ENCOUNTER — Other Ambulatory Visit (HOSPITAL_COMMUNITY): Payer: Self-pay | Admitting: Family Medicine

## 2013-07-15 DIAGNOSIS — Z09 Encounter for follow-up examination after completed treatment for conditions other than malignant neoplasm: Secondary | ICD-10-CM

## 2013-07-21 ENCOUNTER — Ambulatory Visit (HOSPITAL_COMMUNITY)
Admission: RE | Admit: 2013-07-21 | Discharge: 2013-07-21 | Disposition: A | Discharge status: Home / Self Care | Payer: Medicaid Other | Source: Ambulatory Visit | Attending: Family Medicine | Admitting: Family Medicine

## 2013-07-21 DIAGNOSIS — Z09 Encounter for follow-up examination after completed treatment for conditions other than malignant neoplasm: Secondary | ICD-10-CM

## 2013-07-21 DIAGNOSIS — R928 Other abnormal and inconclusive findings on diagnostic imaging of breast: Secondary | ICD-10-CM | POA: Insufficient documentation

## 2014-06-15 ENCOUNTER — Other Ambulatory Visit (HOSPITAL_COMMUNITY): Payer: Self-pay | Admitting: Family Medicine

## 2014-06-15 DIAGNOSIS — Z1231 Encounter for screening mammogram for malignant neoplasm of breast: Secondary | ICD-10-CM

## 2014-07-25 ENCOUNTER — Ambulatory Visit (HOSPITAL_COMMUNITY)
Admission: RE | Admit: 2014-07-25 | Discharge: 2014-07-25 | Disposition: A | Discharge status: Home / Self Care | Payer: Medicaid Other | Source: Ambulatory Visit | Attending: Family Medicine | Admitting: Family Medicine

## 2014-07-25 DIAGNOSIS — Z1231 Encounter for screening mammogram for malignant neoplasm of breast: Secondary | ICD-10-CM | POA: Diagnosis present

## 2014-08-30 ENCOUNTER — Ambulatory Visit: Payer: Self-pay

## 2014-08-30 ENCOUNTER — Other Ambulatory Visit: Payer: Self-pay | Admitting: Occupational Medicine

## 2014-08-30 DIAGNOSIS — R7612 Nonspecific reaction to cell mediated immunity measurement of gamma interferon antigen response without active tuberculosis: Secondary | ICD-10-CM

## 2015-07-19 ENCOUNTER — Other Ambulatory Visit (HOSPITAL_COMMUNITY): Payer: Self-pay | Admitting: Rehabilitative and Restorative Service Providers"

## 2015-07-19 DIAGNOSIS — Z1231 Encounter for screening mammogram for malignant neoplasm of breast: Secondary | ICD-10-CM

## 2015-07-27 ENCOUNTER — Ambulatory Visit (HOSPITAL_COMMUNITY): Payer: Self-pay

## 2015-08-09 ENCOUNTER — Ambulatory Visit (HOSPITAL_COMMUNITY): Payer: Self-pay

## 2015-08-11 ENCOUNTER — Ambulatory Visit (HOSPITAL_COMMUNITY): Payer: Self-pay

## 2015-08-14 ENCOUNTER — Ambulatory Visit (HOSPITAL_COMMUNITY)
Admission: RE | Admit: 2015-08-14 | Discharge: 2015-08-14 | Disposition: A | Discharge status: Home / Self Care | Payer: Medicaid Other | Source: Ambulatory Visit | Attending: Rehabilitative and Restorative Service Providers" | Admitting: Rehabilitative and Restorative Service Providers"

## 2015-08-14 DIAGNOSIS — Z1231 Encounter for screening mammogram for malignant neoplasm of breast: Secondary | ICD-10-CM | POA: Diagnosis not present

## 2016-05-25 ENCOUNTER — Emergency Department (HOSPITAL_COMMUNITY): Payer: Medicaid Other

## 2016-05-25 ENCOUNTER — Emergency Department (HOSPITAL_COMMUNITY)
Admission: EM | Admit: 2016-05-25 | Discharge: 2016-05-25 | Disposition: A | Discharge status: Home / Self Care | Payer: Medicaid Other | Attending: Emergency Medicine | Admitting: Emergency Medicine

## 2016-05-25 ENCOUNTER — Encounter (HOSPITAL_COMMUNITY): Payer: Self-pay

## 2016-05-25 DIAGNOSIS — J449 Chronic obstructive pulmonary disease, unspecified: Secondary | ICD-10-CM | POA: Diagnosis not present

## 2016-05-25 DIAGNOSIS — R51 Headache: Secondary | ICD-10-CM | POA: Diagnosis not present

## 2016-05-25 DIAGNOSIS — Z79899 Other long term (current) drug therapy: Secondary | ICD-10-CM | POA: Insufficient documentation

## 2016-05-25 DIAGNOSIS — F17210 Nicotine dependence, cigarettes, uncomplicated: Secondary | ICD-10-CM | POA: Insufficient documentation

## 2016-05-25 DIAGNOSIS — I10 Essential (primary) hypertension: Secondary | ICD-10-CM | POA: Diagnosis not present

## 2016-05-25 DIAGNOSIS — R519 Headache, unspecified: Secondary | ICD-10-CM

## 2016-05-25 HISTORY — DX: Essential (primary) hypertension: I10

## 2016-05-25 HISTORY — DX: Chronic obstructive pulmonary disease, unspecified: J44.9

## 2016-05-25 NOTE — Discharge Instructions (Signed)
Take over the counter tylenol and ibuprofen (OR excedrin) and benadryl, as directed on packaging, as needed for headache.  Keep a headache diary, as discussed.  Call your regular medical doctor and the Neurologist on Monday to schedule a follow up appointment within the next week.  Return to the Emergency Department immediately sooner if worsening.

## 2016-05-25 NOTE — ED Notes (Signed)
Pt left before dc instructions can be given. Refused CT scan.

## 2016-05-25 NOTE — ED Provider Notes (Signed)
CSN: [id number]     Arrival date & time 05/25/16  1355 History   First MD Initiated Contact with Patient 05/25/16 1445     Chief Complaint  Patient presents with  . Headache     HPI Pt was seen at 1455. Per pt, c/o gradual onset and persistence of several intermittent episodes of "headache" for the past 1 week. Describes the headache as located "aching" and "all over my head." Pt has been taking either alleve or excedrin with complete relief of her headache.  Denies headache was sudden or maximal in onset or at any time.  Denies visual changes, no focal motor weakness, no tingling/numbness in extremities, no fevers, no neck pain, no rash.      Past Medical History  Diagnosis Date  . COPD (chronic obstructive pulmonary disease) (HCC)   . Hypertension    Past Surgical History  Procedure Laterality Date  . Bladder repair      Social History  Substance Use Topics  . Smoking status: Current Some Day Smoker -- 0.50 packs/day    Types: Cigarettes  . Smokeless tobacco: None  . Alcohol Use: No    Review of Systems ROS: Statement: All systems negative except as marked or noted in the HPI; Constitutional: Negative for fever and chills. ; ; Eyes: Negative for eye pain, redness and discharge. ; ; ENMT: Negative for ear pain, hoarseness, nasal congestion, sinus pressure and sore throat. ; ; Cardiovascular: Negative for chest pain, palpitations, diaphoresis, dyspnea and peripheral edema. ; ; Respiratory: Negative for cough, wheezing and stridor. ; ; Gastrointestinal: Negative for nausea, vomiting, diarrhea, abdominal pain, blood in stool, hematemesis, jaundice and rectal bleeding. . ; ; Genitourinary: Negative for dysuria, flank pain and hematuria. ; ; Musculoskeletal: Negative for back pain and neck pain. Negative for swelling and trauma.; ; Skin: Negative for pruritus, rash, abrasions, blisters, bruising and skin lesion.; ; Neuro: +headache. Negative for lightheadedness and neck stiffness.  Negative for weakness, altered level of consciousness, altered mental status, extremity weakness, paresthesias, involuntary movement, seizure and syncope.      Allergies  Penicillins  Home Medications   Prior to Admission medications   Medication Sig Start Date End Date Taking? Authorizing Provider  albuterol (PROAIR HFA) 108 (90 Base) MCG/ACT inhaler Inhale 2 puffs into the lungs every 6 (six) hours as needed.   Yes Historical Provider, MD  beclomethasone (QVAR) 80 MCG/ACT inhaler Inhale 2 puffs into the lungs 2 (two) times daily.   Yes Historical Provider, MD  ferrous sulfate 325 (65 FE) MG tablet Take 325 mg by mouth daily with breakfast.   Yes Historical Provider, MD  lisinopril-hydrochlorothiazide (PRINZIDE,ZESTORETIC) 20-12.5 MG tablet Take 1 tablet by mouth daily.   Yes Historical Provider, MD  lurasidone (LATUDA) 40 MG TABS tablet Take 40 mg by mouth daily with breakfast.   Yes Historical Provider, MD  metoprolol succinate (TOPROL XL) 25 MG 24 hr tablet Take 25 mg by mouth daily.   Yes Historical Provider, MD  Multiple Vitamins-Minerals (MULTIVITAMINS THER. W/MINERALS) TABS tablet Take 1 tablet by mouth daily.   Yes Historical Provider, MD  sertraline (ZOLOFT) 100 MG tablet Take 200 mg by mouth daily.   Yes Historical Provider, MD  tiotropium (SPIRIVA) 18 MCG inhalation capsule Place 18 mcg into inhaler and inhale daily.   Yes Historical Provider, MD  Vitamin D, Ergocalciferol, (DRISDOL) 50000 units CAPS capsule Take 1 capsule by mouth once a week. Takes on Monday   Yes Historical Provider, MD   BP  145/78 mmHg  Pulse 85  Temp(Src) 98.4 F (36.9 C) (Oral)  Resp 16  Ht $RemoveBefore EID_HlCCHEJDzbbazbZlBhJivYLmbBnAiASw$5\' 3" 1500: Physical examination:  Nursing notes reviewed; Vital signs and O2 SAT reviewed;  Constitutional: Well developed, Well nourished, Well hydrated, In no acute distress; Head:  Normocephalic, atraumatic; Eyes: EOMI, PERRL, No scleral icterus; ENMT: TM's  clear bilat. +edemetous nasal turbinates bilat with clear rhinorrhea. Mouth and pharynx normal, Mucous membranes moist; Neck: Supple, Full range of motion, No lymphadenopathy; Cardiovascular: Regular rate and rhythm, No murmur, rub, or gallop; Respiratory: Breath sounds clear & equal bilaterally, No rales, rhonchi, wheezes.  Speaking full sentences with ease, Normal respiratory effort/excursion; Chest: Nontender, Movement normal; Abdomen: Soft, Nontender, Nondistended, Normal bowel sounds; Genitourinary: No CVA tenderness; Extremities: Pulses normal, No tenderness, No edema, No calf edema or asymmetry.; Neuro: AA&Ox3, Major CN grossly intact. No facial droop. Speech clear. No gross focal motor or sensory deficits in extremities. Climbs on and off stretcher easily by herself. Gait steady.; Skin: Color normal, Warm, Dry.   ED Course  Procedures (including critical care time) Labs Review  Imaging Review  I have personally reviewed and evaluated these images and lab results as part of my medical decision-making.   EKG Interpretation None      MDM  MDM Reviewed: previous chart, nursing note and vitals Interpretation: CT scan     1525: Pt now states she "doesn't want the CT scan" and wants to leave. No red flags on H&P. Tx symptomatically. Pt encouraged to f/u with PMD and Neuro MD this week. Pt verb understanding. Dx d/w pt.  Questions answered.  Verb understanding, agreeable to d/c home with outpt f/u.   Samuel JesterKathleen Joanell Cressler, DO 05/29/16 2037

## 2016-05-25 NOTE — ED Notes (Signed)
Reports of frequent headaches x1 week. States she had another one today took Excedrin and now pain is better. States "I came in to find out why I am having them". Denies any other complaints.

## 2016-07-12 ENCOUNTER — Encounter (HOSPITAL_COMMUNITY): Payer: Self-pay | Admitting: Emergency Medicine

## 2016-07-12 ENCOUNTER — Emergency Department (HOSPITAL_COMMUNITY)
Admission: EM | Admit: 2016-07-12 | Discharge: 2016-07-12 | Disposition: A | Discharge status: Home / Self Care | Payer: Medicaid Other | Attending: Emergency Medicine | Admitting: Emergency Medicine

## 2016-07-12 DIAGNOSIS — J449 Chronic obstructive pulmonary disease, unspecified: Secondary | ICD-10-CM | POA: Diagnosis not present

## 2016-07-12 DIAGNOSIS — S80861A Insect bite (nonvenomous), right lower leg, initial encounter: Secondary | ICD-10-CM | POA: Insufficient documentation

## 2016-07-12 DIAGNOSIS — Y939 Activity, unspecified: Secondary | ICD-10-CM | POA: Insufficient documentation

## 2016-07-12 DIAGNOSIS — S80862A Insect bite (nonvenomous), left lower leg, initial encounter: Secondary | ICD-10-CM | POA: Diagnosis not present

## 2016-07-12 DIAGNOSIS — Y929 Unspecified place or not applicable: Secondary | ICD-10-CM | POA: Insufficient documentation

## 2016-07-12 DIAGNOSIS — Y999 Unspecified external cause status: Secondary | ICD-10-CM | POA: Diagnosis not present

## 2016-07-12 DIAGNOSIS — R21 Rash and other nonspecific skin eruption: Secondary | ICD-10-CM | POA: Diagnosis present

## 2016-07-12 DIAGNOSIS — F17210 Nicotine dependence, cigarettes, uncomplicated: Secondary | ICD-10-CM | POA: Insufficient documentation

## 2016-07-12 DIAGNOSIS — I10 Essential (primary) hypertension: Secondary | ICD-10-CM | POA: Diagnosis not present

## 2016-07-12 DIAGNOSIS — S30861A Insect bite (nonvenomous) of abdominal wall, initial encounter: Secondary | ICD-10-CM | POA: Insufficient documentation

## 2016-07-12 DIAGNOSIS — W57XXXA Bitten or stung by nonvenomous insect and other nonvenomous arthropods, initial encounter: Secondary | ICD-10-CM | POA: Diagnosis not present

## 2016-07-12 MED ORDER — DIPHENHYDRAMINE HCL 25 MG PO CAPS: 25.0000 mg | ORAL_CAPSULE | ORAL | Status: DC | PRN

## 2016-07-12 MED ORDER — PREDNISONE 20 MG PO TABS
40.0000 mg | ORAL_TABLET | Freq: Once | ORAL | Status: AC
  Administered 2016-07-12: 40 mg via ORAL
  Filled 2016-07-12: qty 2

## 2016-07-12 MED ORDER — PREDNISONE 10 MG PO TABS: ORAL_TABLET | ORAL | Status: DC

## 2016-07-12 MED ORDER — DIPHENHYDRAMINE HCL 25 MG PO CAPS
25.0000 mg | ORAL_CAPSULE | Freq: Once | ORAL | Status: AC
  Administered 2016-07-12: 25 mg via ORAL
  Filled 2016-07-12: qty 1

## 2016-07-12 NOTE — ED Provider Notes (Signed)
CSN: [id number]     Arrival date & time 07/12/16  1601 History   First MD Initiated Contact with Patient 07/12/16 1628     Chief Complaint  Patient presents with  . Rash     (Consider location/radiation/quality/duration/timing/severity/associated sxs/prior Treatment) HPI   [name] is a 50 y.o. female who presents to the Emergency Department complaining of itching and rash for one day.  She reports being outside recently and may have been bitten by insects, also reports having animals in her home that may have fleas.  She denies fever, chills, tick bite, pain and swelling. She has not tried any therapies.  Nothing makes her sx's better or worse.    Past Medical History  Diagnosis Date  . COPD (chronic obstructive pulmonary disease) (HCC)   . Hypertension    Past Surgical History  Procedure Laterality Date  . Bladder repair     History reviewed. No pertinent family history. Social History  Substance Use Topics  . Smoking status: Current Some Day Smoker -- 0.50 packs/day    Types: Cigarettes  . Smokeless tobacco: None  . Alcohol Use: No   OB History    No data available     Review of Systems  Constitutional: Negative for fever, chills, activity change and appetite change.  HENT: Negative for facial swelling, sore throat and trouble swallowing.   Respiratory: Negative for chest tightness, shortness of breath and wheezing.   Musculoskeletal: Negative for neck pain and neck stiffness.  Skin: Positive for rash. Negative for wound.  Neurological: Negative for dizziness, weakness, numbness and headaches.  All other systems reviewed and are negative.     Allergies  Penicillins  Home Medications   Prior to Admission medications   Medication Sig Start Date End Date Taking? Authorizing Provider  albuterol (PROAIR HFA) 108 (90 Base) MCG/ACT inhaler Inhale 2 puffs into the lungs every 6 (six) hours as needed.    Historical Provider, MD  beclomethasone (QVAR) 80  MCG/ACT inhaler Inhale 2 puffs into the lungs 2 (two) times daily.    Historical Provider, MD  ferrous sulfate 325 (65 FE) MG tablet Take 325 mg by mouth daily with breakfast.    Historical Provider, MD  lisinopril-hydrochlorothiazide (PRINZIDE,ZESTORETIC) 20-12.5 MG tablet Take 1 tablet by mouth daily.    Historical Provider, MD  lurasidone (LATUDA) 40 MG TABS tablet Take 40 mg by mouth daily with breakfast.    Historical Provider, MD  metoprolol succinate (TOPROL XL) 25 MG 24 hr tablet Take 25 mg by mouth daily.    Historical Provider, MD  Multiple Vitamins-Minerals (MULTIVITAMINS THER. W/MINERALS) TABS tablet Take 1 tablet by mouth daily.    Historical Provider, MD  sertraline (ZOLOFT) 100 MG tablet Take 200 mg by mouth daily.    Historical Provider, MD  tiotropium (SPIRIVA) 18 MCG inhalation capsule Place 18 mcg into inhaler and inhale daily.    Historical Provider, MD  Vitamin D, Ergocalciferol, (DRISDOL) 50000 units CAPS capsule Take 1 capsule by mouth once a week. Takes on Monday    Historical Provider, MD   BP 136/78 mmHg  Pulse 104  Temp(Src) 98.2 F (36.8 C) (Oral)  Resp 16  Ht 5\' 3"  (1.6 m)  Wt 81.647 kg  BMI 31.89 kg/m2  SpO2 98%  LMP 07/08/2016 Physical Exam  Constitutional: She is oriented to person, place, and time. She appears well-developed and well-nourished. No distress.  HENT:  Head: Normocephalic and atraumatic.  Mouth/Throat: Oropharynx is clear and moist.  Neck: Neck  supple.  Cardiovascular: Normal rate, regular rhythm and intact distal pulses.   Pulmonary/Chest: Effort normal and breath sounds normal. No respiratory distress.  Musculoskeletal: She exhibits no edema or tenderness.  Lymphadenopathy:    She has no cervical adenopathy.  Neurological: She is alert and oriented to person, place, and time. She exhibits normal muscle tone. Coordination normal.  Skin: Skin is warm. Rash noted. There is erythema.  Multiple erythematous papules to the trunk and bilateral  LE's.  No vesicles or pustules.  Nursing note and vitals reviewed.   ED Course  Procedures (including critical care time) Labs Review Labs Reviewed - No data to display  Imaging Review No results found. I have personally reviewed and evaluated these images and lab results as part of my medical decision-making.   EKG Interpretation None      MDM   Final diagnoses:  Insect bites   Pt well appearing.  Multiple scattered erythematous papules that appear c/w insect/flea bites.  rx for prednisone and benadryl.  Stable for d/c      Pauline Aus, PA-C 07/13/16 1355  Donnetta Hutching, MD 07/13/16 1818

## 2016-07-12 NOTE — ED Notes (Signed)
Patient complaining of rash to bilateral legs, arms, and back since yesterday.

## 2016-07-15 ENCOUNTER — Other Ambulatory Visit (HOSPITAL_COMMUNITY): Payer: Self-pay | Admitting: Family Medicine

## 2016-07-15 DIAGNOSIS — Z1231 Encounter for screening mammogram for malignant neoplasm of breast: Secondary | ICD-10-CM

## 2016-08-19 ENCOUNTER — Ambulatory Visit (HOSPITAL_COMMUNITY)
Admission: RE | Admit: 2016-08-19 | Discharge: 2016-08-19 | Disposition: A | Discharge status: Home / Self Care | Payer: Medicaid Other | Source: Ambulatory Visit | Attending: Family Medicine | Admitting: Family Medicine

## 2016-08-19 DIAGNOSIS — Z1231 Encounter for screening mammogram for malignant neoplasm of breast: Secondary | ICD-10-CM | POA: Diagnosis not present

## 2017-08-15 ENCOUNTER — Other Ambulatory Visit (HOSPITAL_COMMUNITY): Payer: Self-pay | Admitting: Family Medicine

## 2017-08-15 DIAGNOSIS — Z1231 Encounter for screening mammogram for malignant neoplasm of breast: Secondary | ICD-10-CM

## 2017-08-20 ENCOUNTER — Ambulatory Visit (HOSPITAL_COMMUNITY)
Admission: RE | Admit: 2017-08-20 | Discharge: 2017-08-20 | Disposition: A | Discharge status: Home / Self Care | Payer: Medicaid Other | Source: Ambulatory Visit | Attending: Family Medicine | Admitting: Family Medicine

## 2017-08-20 ENCOUNTER — Encounter (HOSPITAL_COMMUNITY): Payer: Self-pay | Admitting: Radiology

## 2017-08-20 DIAGNOSIS — Z1231 Encounter for screening mammogram for malignant neoplasm of breast: Secondary | ICD-10-CM | POA: Diagnosis not present

## 2018-04-03 ENCOUNTER — Encounter (HOSPITAL_COMMUNITY): Payer: Self-pay | Admitting: Emergency Medicine

## 2018-04-03 ENCOUNTER — Emergency Department (HOSPITAL_COMMUNITY)
Admission: EM | Admit: 2018-04-03 | Discharge: 2018-04-03 | Disposition: A | Discharge status: Home / Self Care | Payer: Self-pay | Attending: Emergency Medicine | Admitting: Emergency Medicine

## 2018-04-03 ENCOUNTER — Other Ambulatory Visit: Payer: Self-pay

## 2018-04-03 DIAGNOSIS — M25551 Pain in right hip: Secondary | ICD-10-CM | POA: Insufficient documentation

## 2018-04-03 DIAGNOSIS — R5383 Other fatigue: Secondary | ICD-10-CM | POA: Insufficient documentation

## 2018-04-03 DIAGNOSIS — M25572 Pain in left ankle and joints of left foot: Secondary | ICD-10-CM | POA: Insufficient documentation

## 2018-04-03 DIAGNOSIS — Z5321 Procedure and treatment not carried out due to patient leaving prior to being seen by health care provider: Secondary | ICD-10-CM | POA: Insufficient documentation

## 2018-04-03 HISTORY — DX: Major depressive disorder, single episode, unspecified: F32.9

## 2018-04-03 HISTORY — DX: Depression, unspecified: F32.A

## 2018-04-03 NOTE — ED Triage Notes (Signed)
Pt reports an increase in fatigue. States recent change in BP medication. Denies recent illness. Pt also c/o RT hip pain and LT ankle pain. Denies injury/fall.

## 2018-04-03 NOTE — ED Notes (Signed)
Pt in c/o fatigue for the past few years and chronic pain to left and right leg as well.  Pt says her BP meds were adjusted recently by PCP.

## 2018-04-03 NOTE — ED Notes (Signed)
Pt not in room on 2nd round

## 2018-04-03 NOTE — ED Notes (Signed)
Pt not in room when provider went in to assess.

## 2018-04-03 NOTE — ED Notes (Signed)
Checked with registration, pt was not seen leaving facility nor did she check out with them.

## 2019-04-01 ENCOUNTER — Emergency Department (HOSPITAL_COMMUNITY): Payer: Self-pay

## 2019-04-01 ENCOUNTER — Emergency Department (HOSPITAL_COMMUNITY)
Admission: EM | Admit: 2019-04-01 | Discharge: 2019-04-01 | Disposition: A | Discharge status: Home / Self Care | Payer: Self-pay | Attending: Emergency Medicine | Admitting: Emergency Medicine

## 2019-04-01 ENCOUNTER — Other Ambulatory Visit: Payer: Self-pay

## 2019-04-01 ENCOUNTER — Encounter (HOSPITAL_COMMUNITY): Payer: Self-pay

## 2019-04-01 DIAGNOSIS — J449 Chronic obstructive pulmonary disease, unspecified: Secondary | ICD-10-CM | POA: Insufficient documentation

## 2019-04-01 DIAGNOSIS — E119 Type 2 diabetes mellitus without complications: Secondary | ICD-10-CM | POA: Insufficient documentation

## 2019-04-01 DIAGNOSIS — R1010 Upper abdominal pain, unspecified: Secondary | ICD-10-CM

## 2019-04-01 DIAGNOSIS — Z79899 Other long term (current) drug therapy: Secondary | ICD-10-CM | POA: Insufficient documentation

## 2019-04-01 DIAGNOSIS — R110 Nausea: Secondary | ICD-10-CM | POA: Insufficient documentation

## 2019-04-01 DIAGNOSIS — F17210 Nicotine dependence, cigarettes, uncomplicated: Secondary | ICD-10-CM | POA: Insufficient documentation

## 2019-04-01 DIAGNOSIS — R1012 Left upper quadrant pain: Secondary | ICD-10-CM | POA: Insufficient documentation

## 2019-04-01 DIAGNOSIS — I10 Essential (primary) hypertension: Secondary | ICD-10-CM | POA: Insufficient documentation

## 2019-04-01 DIAGNOSIS — Z880 Allergy status to penicillin: Secondary | ICD-10-CM | POA: Insufficient documentation

## 2019-04-01 DIAGNOSIS — R000 Tachycardia, unspecified: Secondary | ICD-10-CM | POA: Insufficient documentation

## 2019-04-01 HISTORY — DX: Type 2 diabetes mellitus without complications: E11.9

## 2019-04-01 LAB — URINALYSIS, ROUTINE W REFLEX MICROSCOPIC
Bilirubin Urine: NEGATIVE
Glucose, UA: NEGATIVE mg/dL
Hgb urine dipstick: NEGATIVE
Ketones, ur: NEGATIVE mg/dL
Leukocytes,Ua: NEGATIVE
Nitrite: NEGATIVE
Protein, ur: NEGATIVE mg/dL
Specific Gravity, Urine: >1.046 — ABNORMAL HIGH (ref 1.005–1.030)
pH: 5 (ref 5.0–8.0)

## 2019-04-01 LAB — COMPREHENSIVE METABOLIC PANEL
ALT: 25 U/L (ref 0–44)
AST: 21 U/L (ref 15–41)
Albumin: 4.4 g/dL (ref 3.5–5.0)
Alkaline Phosphatase: 114 U/L (ref 38–126)
Anion gap: 9 (ref 5–15)
BUN: 19 mg/dL (ref 6–20)
CO2: 27 mmol/L (ref 22–32)
Calcium: 9.4 mg/dL (ref 8.9–10.3)
Chloride: 104 mmol/L (ref 98–111)
Creatinine, Ser: 0.79 mg/dL (ref 0.44–1.00)
GFR calc Af Amer: >60 mL/min (ref >60)
GFR calc non Af Amer: >60 mL/min (ref >60)
Glucose, Bld: 190 mg/dL — ABNORMAL HIGH (ref 70–99)
Potassium: 3.4 mmol/L — ABNORMAL LOW (ref 3.5–5.1)
Sodium: 140 mmol/L (ref 135–145)
Total Bilirubin: 0.6 mg/dL (ref 0.3–1.2)
Total Protein: 8 g/dL (ref 6.5–8.1)

## 2019-04-01 LAB — CBC WITH DIFFERENTIAL/PLATELET
Abs Immature Granulocytes: 0.04 10*3/uL (ref 0.00–0.07)
Basophils Absolute: 0 10*3/uL (ref 0.0–0.1)
Basophils Relative: 0 %
Eosinophils Absolute: 0.1 10*3/uL (ref 0.0–0.5)
Eosinophils Relative: 1 %
HCT: 46.4 % — ABNORMAL HIGH (ref 36.0–46.0)
Hemoglobin: 15.4 g/dL — ABNORMAL HIGH (ref 12.0–15.0)
Immature Granulocytes: 0 %
Lymphocytes Relative: 14 %
Lymphs Abs: 1.6 10*3/uL (ref 0.7–4.0)
MCH: 28.3 pg (ref 26.0–34.0)
MCHC: 33.2 g/dL (ref 30.0–36.0)
MCV: 85.3 fL (ref 80.0–100.0)
Monocytes Absolute: 0.5 10*3/uL (ref 0.1–1.0)
Monocytes Relative: 5 %
Neutro Abs: 9.6 10*3/uL — ABNORMAL HIGH (ref 1.7–7.7)
Neutrophils Relative %: 80 %
Platelets: 266 10*3/uL (ref 150–400)
RBC: 5.44 MIL/uL — ABNORMAL HIGH (ref 3.87–5.11)
RDW: 13.2 % (ref 11.5–15.5)
WBC: 11.8 10*3/uL — ABNORMAL HIGH (ref 4.0–10.5)
nRBC: 0 % (ref 0.0–0.2)

## 2019-04-01 LAB — LIPASE, BLOOD: Lipase: 46 U/L (ref 11–51)

## 2019-04-01 MED ORDER — IOHEXOL 300 MG/ML  SOLN
100.0000 mL | Freq: Once | INTRAMUSCULAR | Status: AC | PRN
  Administered 2019-04-01: 15:00:00 100 mL via INTRAVENOUS

## 2019-04-01 MED ORDER — LACTATED RINGERS IV BOLUS
1000.0000 mL | Freq: Once | INTRAVENOUS | Status: AC
  Administered 2019-04-01: 1000 mL via INTRAVENOUS

## 2019-04-01 MED ORDER — FAMOTIDINE IN NACL 20-0.9 MG/50ML-% IV SOLN
20.0000 mg | Freq: Once | INTRAVENOUS | Status: AC
  Administered 2019-04-01: 14:00:00 20 mg via INTRAVENOUS
  Filled 2019-04-01: qty 50

## 2019-04-01 MED ORDER — LIDOCAINE VISCOUS HCL 2 % MT SOLN
15.0000 mL | Freq: Once | OROMUCOSAL | Status: AC
  Administered 2019-04-01: 14:00:00 15 mL via ORAL
  Filled 2019-04-01: qty 15

## 2019-04-01 MED ORDER — TRAMADOL HCL 50 MG PO TABS: 50.0000 mg | ORAL_TABLET | Freq: Four times a day (QID) | ORAL | 0 refills | Status: DC | PRN

## 2019-04-01 MED ORDER — ALUM & MAG HYDROXIDE-SIMETH 200-200-20 MG/5ML PO SUSP
30.0000 mL | Freq: Once | ORAL | Status: AC
  Administered 2019-04-01: 30 mL via ORAL
  Filled 2019-04-01: qty 30

## 2019-04-01 NOTE — ED Triage Notes (Signed)
Pt reports abd pain off and on for  Past few months.   Also reports intermittent diarrhea.  Pt also says has a cough but thinks is because she smokes.  Pt says she feels weak and feels like skin is tingling.  LBM was this morning.

## 2019-04-01 NOTE — ED Provider Notes (Signed)
[facility] EMERGENCY DEPARTMENT Provider Note   CSN: [id number] Arrival date & time: 04/01/19  1337    History   Chief Complaint Chief Complaint  Patient presents with  . Abdominal Pain    HPI [name] is a 53 y.o. female.     HPI   53 year old female with abdominal pain.  Intermittent for the last several weeks.  She feels like it is been worse today.  Pain is in the epigastric and left upper quadrant.  Waxes and wanes without appreciable exacerbating factors.  Mild nausea.  No vomiting or diarrhea.  Stool has been dark in color but says that this is chronic which she attributes to iron supplementation.  No urinary complaints.  No prior abdominal surgery. She takes naproxen twice a day and has been doing this for months. No past hx of GERD/PUD.   Past Medical History:  Diagnosis Date  . COPD (chronic obstructive pulmonary disease) (HCC)   . Depression   . Diabetes mellitus without complication (HCC)   . Hypertension     There are no active problems to display for this patient.   Past Surgical History:  Procedure Laterality Date  . BLADDER REPAIR       OB History   No obstetric history on file.      Home Medications    Prior to Admission medications   Medication Sig Start Date End Date Taking? Authorizing Provider  albuterol (PROAIR HFA) 108 (90 Base) MCG/ACT inhaler Inhale 2 puffs into the lungs every 6 (six) hours as needed. Inhale 2 puffs into lungs every 4 to 6 hours as needed for wheezing   Yes [provider]  losartan-hydrochlorothiazide (HYZAAR) 50-12.5 MG tablet Take 1 tablet by mouth daily.   Yes [provider]  metoprolol tartrate (LOPRESSOR) 50 MG tablet Take 50 mg by mouth 2 (two) times daily.   Yes [provider]  Vitamin D, Ergocalciferol, (DRISDOL) 50000 units CAPS capsule Take 1 capsule by mouth once a week. Takes on Monday   Yes [provider]  beclomethasone (QVAR) 80 MCG/ACT inhaler Inhale 2 puffs  into the lungs 2 (two) times daily.    [provider]  ferrous sulfate 325 (65 FE) MG tablet Take 325 mg by mouth daily with breakfast.    [provider]  lisinopril-hydrochlorothiazide (PRINZIDE,ZESTORETIC) 20-12.5 MG tablet Take 1 tablet by mouth daily.    [provider]  lurasidone (LATUDA) 40 MG TABS tablet Take 40 mg by mouth daily with breakfast.    [provider]  Multiple Vitamins-Minerals (MULTIVITAMINS THER. W/MINERALS) TABS tablet Take 1 tablet by mouth daily.    [provider]  predniSONE (DELTASONE) 10 MG tablet Take 6 tablets day one, 5 tablets day two, 4 tablets day three, 3 tablets day four, 2 tablets day five, then 1 tablet day six Patient not taking: Reported on 04/01/2019 07/12/16   Triplett, Tammy, PA-C  sertraline (ZOLOFT) 100 MG tablet Take 200 mg by mouth daily.    [provider]  tiotropium (SPIRIVA) 18 MCG inhalation capsule Place 18 mcg into inhaler and inhale daily.    [provider]    Family History No family history on file.  Social History Social History   Tobacco Use  . Smoking status: Current Some Day Smoker    Packs/day: 1.00    Types: Cigarettes  . Smokeless tobacco: Never Used  Substance Use Topics  . Alcohol use: No  . Drug use: No  Allergies   Penicillins   Review of Systems Review of Systems  All systems reviewed and negative, other than as noted in HPI.  Physical Exam Updated Vital Signs BP (!) 170/99 (BP Location: Left Arm)   Pulse (!) 129   Temp 98.5 F (36.9 C) (Oral)   Resp 20   Ht 5\' 3"  (1.6 m)   Wt 72.6 kg   SpO2 99%   BMI 28.34 kg/m   Physical Exam Vitals signs and nursing note reviewed.  Constitutional:      General: She is not in acute distress.    Appearance: She is well-developed.  HENT:     Head: Normocephalic and atraumatic.  Eyes:     General:        Right eye: No discharge.        Left eye: No discharge.     Conjunctiva/sclera:  Conjunctivae normal.  Neck:     Musculoskeletal: Neck supple.  Cardiovascular:     Rate and Rhythm: Regular rhythm. Tachycardia present.     Heart sounds: Normal heart sounds. No murmur. No friction rub. No gallop.   Pulmonary:     Effort: Pulmonary effort is normal. No respiratory distress.     Breath sounds: Normal breath sounds.  Abdominal:     General: There is no distension.     Palpations: Abdomen is soft.     Tenderness: There is abdominal tenderness in the epigastric area.     Comments: Epigastric/LUE tenderness w/o rebound or guarding.   Musculoskeletal:        General: No tenderness.  Skin:    General: Skin is warm and dry.  Neurological:     Mental Status: She is alert.  Psychiatric:        Behavior: Behavior normal.        Thought Content: Thought content normal.      ED Treatments / Results  Labs (all labs ordered are listed, but only abnormal results are displayed) Labs Reviewed  CBC WITH DIFFERENTIAL/PLATELET - Abnormal; Notable for the following components:      Result Value   WBC 11.8 (*)    RBC 5.44 (*)    Hemoglobin 15.4 (*)    HCT 46.4 (*)    Neutro Abs 9.6 (*)    All other components within normal limits  COMPREHENSIVE METABOLIC PANEL - Abnormal; Notable for the following components:   Potassium 3.4 (*)    Glucose, Bld 190 (*)    All other components within normal limits  URINALYSIS, ROUTINE W REFLEX MICROSCOPIC - Abnormal; Notable for the following components:   Specific Gravity, Urine >1.046 (*)    All other components within normal limits  LIPASE, BLOOD    EKG None  Radiology No results found.  Procedures Procedures (including critical care time)  Medications Ordered in ED Medications  famotidine (PEPCID) IVPB 20 mg premix (20 mg Intravenous New Bag/Given 04/01/19 1418)  alum & mag hydroxide-simeth (MAALOX/MYLANTA) 200-200-20 MG/5ML suspension 30 mL (30 mLs Oral Given 04/01/19 1418)    And  lidocaine (XYLOCAINE) 2 % viscous mouth  solution 15 mL (15 mLs Oral Given 04/01/19 1418)  lactated ringers bolus 1,000 mL (1,000 mLs Intravenous New Bag/Given 04/01/19 1420)     Initial Impression / Assessment and Plan / ED Course  I have reviewed the triage vital signs and the nursing notes.  Pertinent labs & imaging results that were available during my care of the patient were reviewed by me and considered in my medical decision  making (see chart for details).        52yF with abdominal pain. Related to cholelithiasis? W/u otherwise fairly unremarkable. Does take NSAIDs regularly. Consider PUD/gastritis. Advised to limit NSAIDs the best she can and advised to take prilosec. General surgery FU for persistent symptoms. Return precautions discussed.   Final Clinical Impressions(s) / ED Diagnoses   Final diagnoses:  Pain of upper abdomen    ED Discharge Orders         Ordered    traMADol (ULTRAM) 50 MG tablet  Every 6 hours PRN     04/01/19 2006           Raeford Razor, MD 04/02/19 1226

## 2019-04-01 NOTE — ED Notes (Signed)
Lab called, pt did not have enough urine sample, will need to recollect.

## 2019-04-01 NOTE — ED Notes (Signed)
Extra urine given to lab

## 2019-04-21 ENCOUNTER — Other Ambulatory Visit: Payer: Self-pay

## 2019-04-21 ENCOUNTER — Emergency Department (HOSPITAL_COMMUNITY)
Admission: EM | Admit: 2019-04-21 | Discharge: 2019-04-21 | Disposition: A | Discharge status: Home / Self Care | Payer: Self-pay | Attending: Emergency Medicine | Admitting: Emergency Medicine

## 2019-04-21 ENCOUNTER — Encounter (HOSPITAL_COMMUNITY): Payer: Self-pay | Admitting: Emergency Medicine

## 2019-04-21 DIAGNOSIS — Z79899 Other long term (current) drug therapy: Secondary | ICD-10-CM | POA: Insufficient documentation

## 2019-04-21 DIAGNOSIS — F17210 Nicotine dependence, cigarettes, uncomplicated: Secondary | ICD-10-CM | POA: Insufficient documentation

## 2019-04-21 DIAGNOSIS — E119 Type 2 diabetes mellitus without complications: Secondary | ICD-10-CM | POA: Insufficient documentation

## 2019-04-21 DIAGNOSIS — R197 Diarrhea, unspecified: Secondary | ICD-10-CM | POA: Insufficient documentation

## 2019-04-21 DIAGNOSIS — I10 Essential (primary) hypertension: Secondary | ICD-10-CM | POA: Insufficient documentation

## 2019-04-21 DIAGNOSIS — J449 Chronic obstructive pulmonary disease, unspecified: Secondary | ICD-10-CM | POA: Insufficient documentation

## 2019-04-21 DIAGNOSIS — R109 Unspecified abdominal pain: Secondary | ICD-10-CM | POA: Insufficient documentation

## 2019-04-21 DIAGNOSIS — F329 Major depressive disorder, single episode, unspecified: Secondary | ICD-10-CM | POA: Insufficient documentation

## 2019-04-21 DIAGNOSIS — Z7984 Long term (current) use of oral hypoglycemic drugs: Secondary | ICD-10-CM | POA: Insufficient documentation

## 2019-04-21 LAB — COMPREHENSIVE METABOLIC PANEL
ALT: 26 U/L (ref 0–44)
AST: 29 U/L (ref 15–41)
Albumin: 4.2 g/dL (ref 3.5–5.0)
Alkaline Phosphatase: 86 U/L (ref 38–126)
Anion gap: 12 (ref 5–15)
BUN: 15 mg/dL (ref 6–20)
CO2: 22 mmol/L (ref 22–32)
Calcium: 9.2 mg/dL (ref 8.9–10.3)
Chloride: 103 mmol/L (ref 98–111)
Creatinine, Ser: 0.68 mg/dL (ref 0.44–1.00)
GFR calc Af Amer: >60 mL/min (ref >60)
GFR calc non Af Amer: >60 mL/min (ref >60)
Glucose, Bld: 112 mg/dL — ABNORMAL HIGH (ref 70–99)
Potassium: 3.6 mmol/L (ref 3.5–5.1)
Sodium: 137 mmol/L (ref 135–145)
Total Bilirubin: 0.5 mg/dL (ref 0.3–1.2)
Total Protein: 7.4 g/dL (ref 6.5–8.1)

## 2019-04-21 LAB — URINALYSIS, ROUTINE W REFLEX MICROSCOPIC
Bilirubin Urine: NEGATIVE
Glucose, UA: NEGATIVE mg/dL
Hgb urine dipstick: NEGATIVE
Ketones, ur: NEGATIVE mg/dL
Leukocytes,Ua: NEGATIVE
Nitrite: NEGATIVE
Protein, ur: NEGATIVE mg/dL
Specific Gravity, Urine: 1.019 (ref 1.005–1.030)
pH: 5 (ref 5.0–8.0)

## 2019-04-21 LAB — CBC
HCT: 40.9 % (ref 36.0–46.0)
Hemoglobin: 13.8 g/dL (ref 12.0–15.0)
MCH: 28.9 pg (ref 26.0–34.0)
MCHC: 33.7 g/dL (ref 30.0–36.0)
MCV: 85.7 fL (ref 80.0–100.0)
Platelets: 248 10*3/uL (ref 150–400)
RBC: 4.77 MIL/uL (ref 3.87–5.11)
RDW: 13.2 % (ref 11.5–15.5)
WBC: 9.2 10*3/uL (ref 4.0–10.5)
nRBC: 0 % (ref 0.0–0.2)

## 2019-04-21 NOTE — ED Triage Notes (Signed)
Patient reports intermittent abdominal pain and diarrhea x 5 years. She states she has had a fecal smell to her breath and thinks she may have some type of parasite. Reports she thinks she has seen worms in her stool with occas. Small amounts of blood. Patient c/o chronic fatigue.

## 2019-04-21 NOTE — Discharge Instructions (Addendum)
Follow-up with gastroenterology for further evaluation of your abdominal pain diarrhea and feeling of parasites.

## 2019-04-21 NOTE — ED Notes (Signed)
Per pt, she is ready to go home and she is not going to be able to afford anymore additional testing. Notified Dr. Rubin Payor

## 2019-04-21 NOTE — ED Provider Notes (Signed)
[facility] EMERGENCY DEPARTMENT Provider Note   CSN: [id number] Arrival date & time: 04/21/19  1045    History   Chief Complaint Chief Complaint  Patient presents with  . Abdominal Pain    HPI [name] is a 53 y.o. female.     HPI Patient presents with abdominal pain.  Has had diarrhea.  States she has had it for around 5 years.  States she will sometimes feel things moving in there.  Is worried she has a parasite because of this.  States she has seen segmented pieces of something in her stool on and off for years.  Occasionally see some blood.  We will have her weight go up and down at times also.  Recently seen in the ER for abdominal pain and had CT scan that was reassuring except for some gallstones that time.  States she has talked with Dr. Henreitta LeberBridges from general surgery but has not been able to follow-up with her yet.  Has not seen a gastroneurologist.  No dysuria.  Pain is dull and moves around.  States she will have diarrhea at the most 3 times a day.   Past Medical History:  Diagnosis Date  . COPD (chronic obstructive pulmonary disease) (HCC)   . Depression   . Diabetes mellitus without complication (HCC)   . Hypertension     There are no active problems to display for this patient.   Past Surgical History:  Procedure Laterality Date  . BLADDER REPAIR       OB History    Gravida      Para      Term      Preterm      AB      Living  0     SAB      TAB      Ectopic      Multiple      Live Births               Home Medications    Prior to Admission medications   Medication Sig Start Date End Date Taking? Authorizing Provider  albuterol (PROAIR HFA) 108 (90 Base) MCG/ACT inhaler Inhale 2 puffs into the lungs every 6 (six) hours as needed. Inhale 2 puffs into lungs every 4 to 6 hours as needed for wheezing    [provider]  amLODipine (NORVASC) 5 MG tablet Take 5 mg by mouth daily.    [provider]  ferrous  sulfate 325 (65 FE) MG tablet Take 325 mg by mouth daily with breakfast.    [provider]  losartan-hydrochlorothiazide (HYZAAR) 50-12.5 MG tablet Take 1 tablet by mouth daily.    [provider]  metFORMIN (GLUCOPHAGE) 1000 MG tablet Take 1,000 mg by mouth 2 (two) times daily with a meal.    [provider]  metoprolol tartrate (LOPRESSOR) 50 MG tablet Take 50 mg by mouth 2 (two) times daily.    [provider]  Multiple Vitamins-Minerals (MULTIVITAMINS THER. W/MINERALS) TABS tablet Take 1 tablet by mouth daily.    [provider]  predniSONE (DELTASONE) 10 MG tablet Take 6 tablets day one, 5 tablets day two, 4 tablets day three, 3 tablets day four, 2 tablets day five, then 1 tablet day six Patient not taking: Reported on 04/01/2019 07/12/16   Triplett, Tammy, PA-C  traMADol (ULTRAM) 50 MG tablet Take 1 tablet (50 mg total) by mouth every 6 (six) hours as needed. 04/01/19   Raeford RazorKohut, Stephen,  MD  Vitamin D, Ergocalciferol, (DRISDOL) 50000 units CAPS capsule Take 1 capsule by mouth once a week. Takes on Monday    [provider]    Family History Family History  Problem Relation Age of Onset  . Rheum arthritis Mother   . Congestive Heart Failure Father   . Hypertension Father     Social History Social History   Tobacco Use  . Smoking status: Current Some Day Smoker    Packs/day: 1.00    Types: Cigarettes  . Smokeless tobacco: Never Used  Substance Use Topics  . Alcohol use: No  . Drug use: No     Allergies   Penicillins   Review of Systems Review of Systems  Constitutional: Positive for fatigue. Negative for appetite change.  HENT: Negative for congestion.   Respiratory: Negative for shortness of breath.   Cardiovascular: Negative for chest pain.  Gastrointestinal: Positive for abdominal pain and diarrhea. Negative for nausea and vomiting.  Genitourinary: Negative for frequency.  Musculoskeletal: Negative for back pain.   Skin: Negative for rash.  Neurological: Positive for weakness.  Psychiatric/Behavioral: Negative for confusion.     Physical Exam Updated Vital Signs BP (!) 149/96   Pulse 74   Temp 98.2 F (36.8 C) (Oral)   Resp 14   Ht  (1.6 m)   Wt 72.6 kg   SpO2 96%   BMI 28.34 kg/m   Physical Exam Vitals signs and nursing note reviewed.  HENT:     Head: Normocephalic.  Cardiovascular:     Rate and Rhythm: Regular rhythm.  Abdominal:     General: There is no abdominal bruit.     Palpations: There is no fluid wave or mass.     Tenderness: There is no abdominal tenderness.     Hernia: No hernia is present.  Skin:    Capillary Refill: Capillary refill takes less than 2 seconds.     Findings: No erythema.  Neurological:     General: No focal deficit present.     Mental Status: She is alert.      ED Treatments / Results  Labs (all labs ordered are listed, but only abnormal results are displayed) Labs Reviewed  URINALYSIS, ROUTINE W REFLEX MICROSCOPIC - Abnormal; Notable for the following components:      Result Value   APPearance HAZY (*)    All other components within normal limits  COMPREHENSIVE METABOLIC PANEL - Abnormal; Notable for the following components:   Glucose, Bld 112 (*)    All other components within normal limits  CBC    EKG None  Radiology No results found.  Procedures Procedures (including critical care time)  Medications Ordered in ED Medications - No data to display   Initial Impression / Assessment and Plan / ED Course  I have reviewed the triage vital signs and the nursing notes.  Pertinent labs & imaging results that were available during my care of the patient were reviewed by me and considered in my medical decision making (see chart for details).        Patient with 5 years of abdominal pain.  States she feels that she has parasites.  Also diarrhea.  Lab work done and what had come back was reassuring.  Will do a GI follow-up.   However.  Patient was not willing to stay for the results of all the blood work.  Did request GI follow-up  Final Clinical Impressions(s) / ED Diagnoses   Final diagnoses:  Abdominal pain,  unspecified abdominal location  Diarrhea, unspecified type    ED Discharge Orders    None       Benjiman Core, MD 04/21/19 1224

## 2019-04-27 ENCOUNTER — Ambulatory Visit (INDEPENDENT_AMBULATORY_CARE_PROVIDER_SITE_OTHER): Payer: Self-pay | Admitting: Internal Medicine

## 2019-04-27 ENCOUNTER — Encounter (INDEPENDENT_AMBULATORY_CARE_PROVIDER_SITE_OTHER): Payer: Self-pay | Admitting: Internal Medicine

## 2019-04-27 ENCOUNTER — Other Ambulatory Visit: Payer: Self-pay

## 2019-04-27 VITALS — BP 133/74 | HR 81 | Temp 98.5°F | Ht 63.0 in | Wt 167.0 lb

## 2019-04-27 DIAGNOSIS — R197 Diarrhea, unspecified: Secondary | ICD-10-CM | POA: Insufficient documentation

## 2019-04-27 NOTE — Progress Notes (Signed)
Subjective:    Patient ID: [name], female    DOB: 06-25-1966, 53 y.o.   MRN: [id number] PCP [facility]  HPI  Referred by the ED at [facility] (04/21/2019)for abdominal pain. Had been having some diarrhea. Symptoms for a bout 4 years. Per records, she has been concerned she has a parasite. Has been seeing segmented pieces of something in her stools on and off for years. Occasionally sees blood in her stool. She says sometimes she has upper abdominal pain. She is not having any pain at this time. She has the pain when she gets ready to have a BM. Is to see Dr. Henreitta LeberBridges for gallstones. OV has been postponed due to COVID-12 virus.  She says she feels something moving around in her lower intestines for years. She thinks she may have a parasite in her stool. She says her breath smells bad. She says she thinks she saw a worm in her stool about a year ago. She has 2-3 BMs a day. Stools are not formed. Stools are mostly diarrhea. She occasionally has a formed stool.  She has never undergone a colonoscopy. No family hx of colon cancer. Her appetite is okay. She says she has had weight loss.  She has gained about 10 pounds since 04/01/2019.  Wt 07/12/2016 182.2lb. Diabetic x for a couple of years. She has 3 cats.   04/01/2019 CT abdomen/pelvis with CM.  IMPRESSION: Cholelithiasis without inflammation.  No acute abnormality seen in the abdomen or pelvis.  Aortic Atherosclerosis (ICD10-I70.0).   Electronically Signed     Review of Systems Past Medical History:  Diagnosis Date  . COPD (chronic obstructive pulmonary disease) (HCC)   . Depression   . Diabetes mellitus without complication (HCC)   . Hypertension     Past Surgical History:  Procedure Laterality Date  . BLADDER REPAIR      Allergies  Allergen Reactions  . Penicillins Rash    Did it involve swelling of the face/tongue/throat, SOB, or low BP? Unknown Did it involve sudden or severe rash/hives, skin peeling, or any  reaction on the inside of your mouth or nose? Unknown Did you need to seek medical attention at a hospital or doctor's office? Unknown When did it last happen? If all above answers are "NO", may proceed with cephalosporin use.     Current Outpatient Medications on File Prior to Visit  Medication Sig Dispense Refill  . albuterol (PROAIR HFA) 108 (90 Base) MCG/ACT inhaler Inhale 2 puffs into the lungs every 6 (six) hours as needed. Inhale 2 puffs into lungs every 4 to 6 hours as needed for wheezing    . amLODipine (NORVASC) 5 MG tablet Take 5 mg by mouth daily.    . ferrous sulfate 325 (65 FE) MG tablet Take 325 mg by mouth daily with breakfast.    . losartan-hydrochlorothiazide (HYZAAR) 50-12.5 MG tablet Take 1 tablet by mouth daily.    . metFORMIN (GLUCOPHAGE) 1000 MG tablet Take 1,000 mg by mouth 2 (two) times daily with a meal.    . metoprolol tartrate (LOPRESSOR) 50 MG tablet Take 50 mg by mouth 2 (two) times daily.    . Multiple Vitamins-Minerals (MULTIVITAMINS THER. W/MINERALS) TABS tablet Take 1 tablet by mouth daily.    . Vitamin D, Ergocalciferol, (DRISDOL) 50000 units CAPS capsule Take 1 capsule by mouth once a week. Takes on Monday     No current facility-administered medications on file prior to visit.  Objective:   Physical Exam Blood pressure 133/74, pulse 81, temperature 98.5 F (36.9 C), height 5\' 3"  (1.6 m), weight 167 lb (75.8 kg). Alert and oriented. Skin warm and dry. Oral mucosa is moist.   . Sclera anicteric, conjunctivae is pink. Thyroid not enlarged. No cervical lymphadenopathy. Lungs clear. Heart regular rate and rhythm.  Abdomen is soft. Bowel sounds are positive. No hepatomegaly. No abdominal masses felt. No tenderness.  No edema to lower extremities.           Assessment & Plan:  Diarrhea. ? Parasites. Will get an OVA and Para. She will need to be set up for a colonoscopy.

## 2019-04-27 NOTE — Patient Instructions (Addendum)
Stool for OVA and para. GI pathogen.  The risks of bleeding, perforation and infection were reviewed with patient.

## 2019-06-10 ENCOUNTER — Telehealth (INDEPENDENT_AMBULATORY_CARE_PROVIDER_SITE_OTHER): Payer: Self-pay | Admitting: *Deleted

## 2019-06-10 NOTE — Telephone Encounter (Signed)
Patient was seen in office 04/27/19 - I was to call once restrictions have lifted at [facility] -- I have left several messages for patient to give me  Call to scheduled -- no response as yet

## 2019-06-10 NOTE — Telephone Encounter (Signed)
noted 

## 2020-01-14 ENCOUNTER — Telehealth: Payer: Self-pay | Admitting: Family

## 2020-01-14 DIAGNOSIS — R1030 Lower abdominal pain, unspecified: Secondary | ICD-10-CM

## 2020-01-14 NOTE — Progress Notes (Signed)
Based on what you shared with me, I feel your condition warrants further evaluation and I recommend that you be seen for a face to face office visit.  Given your current symptoms of lower abdominal pain, you need to be seen face to face to rule out a serious infection.    NOTE: If you entered your credit card information for this eVisit, you will not be charged. You may see a "hold" on your card for the $35 but that hold will drop off and you will not have a charge processed.   If you are having a true medical emergency please call 911.      For an urgent face to face visit, [facility] has five urgent care centers for your convenience:      NEW:  [facility] at [facility] Directions [phone] [address] [place], [place] [address] . 10 am - 6pm Monday - Friday    [facility]) Get Driving Directions [phone] [address] [place], [place] [address] . 10 am to 8 pm Monday-Friday . 12 pm to 8 pm [facility] Urgent Care at [facility] Get Driving Directions [phone] [address], [address] [place], [place] [address] . 8 am to 8 pm Monday-Friday . 9 am to 6 pm Saturday . 11 am to 6 pm Sunday     [facility] at [facility] Get Driving Directions  [phone] [address] [place], [place] [address] . 8 am to 8 pm Monday-Friday . 8 am to 4 pm [facility] Urgent Care at [facility] Directions [phone] [address] [place], [place] [address] . 12 pm to 6 pm Monday-Friday      Your e-visit answers were reviewed by a board certified advanced clinical practitioner to complete your personal care plan.  Thank you for using e-Visits.

## 2020-05-12 ENCOUNTER — Other Ambulatory Visit: Payer: Self-pay

## 2020-05-12 ENCOUNTER — Emergency Department (HOSPITAL_COMMUNITY)
Admission: EM | Admit: 2020-05-12 | Discharge: 2020-05-12 | Disposition: A | Discharge status: Home / Self Care | Payer: Medicaid Other | Attending: Emergency Medicine | Admitting: Emergency Medicine

## 2020-05-12 ENCOUNTER — Encounter (HOSPITAL_COMMUNITY): Payer: Self-pay

## 2020-05-12 DIAGNOSIS — J449 Chronic obstructive pulmonary disease, unspecified: Secondary | ICD-10-CM | POA: Diagnosis not present

## 2020-05-12 DIAGNOSIS — F17210 Nicotine dependence, cigarettes, uncomplicated: Secondary | ICD-10-CM | POA: Diagnosis not present

## 2020-05-12 DIAGNOSIS — R1084 Generalized abdominal pain: Secondary | ICD-10-CM | POA: Insufficient documentation

## 2020-05-12 DIAGNOSIS — Z79899 Other long term (current) drug therapy: Secondary | ICD-10-CM | POA: Insufficient documentation

## 2020-05-12 DIAGNOSIS — I10 Essential (primary) hypertension: Secondary | ICD-10-CM | POA: Diagnosis not present

## 2020-05-12 DIAGNOSIS — E119 Type 2 diabetes mellitus without complications: Secondary | ICD-10-CM | POA: Diagnosis not present

## 2020-05-12 DIAGNOSIS — Z720 Tobacco use: Secondary | ICD-10-CM

## 2020-05-12 LAB — COMPREHENSIVE METABOLIC PANEL
ALT: 27 U/L (ref 0–44)
AST: 23 U/L (ref 15–41)
Albumin: 3.9 g/dL (ref 3.5–5.0)
Alkaline Phosphatase: 96 U/L (ref 38–126)
Anion gap: 9 (ref 5–15)
BUN: 19 mg/dL (ref 6–20)
CO2: 29 mmol/L (ref 22–32)
Calcium: 9.1 mg/dL (ref 8.9–10.3)
Chloride: 101 mmol/L (ref 98–111)
Creatinine, Ser: 0.96 mg/dL (ref 0.44–1.00)
GFR calc Af Amer: >60 mL/min (ref >60)
GFR calc non Af Amer: >60 mL/min (ref >60)
Glucose, Bld: 125 mg/dL — ABNORMAL HIGH (ref 70–99)
Potassium: 4.3 mmol/L (ref 3.5–5.1)
Sodium: 139 mmol/L (ref 135–145)
Total Bilirubin: 0.7 mg/dL (ref 0.3–1.2)
Total Protein: 7.2 g/dL (ref 6.5–8.1)

## 2020-05-12 LAB — CBC WITH DIFFERENTIAL/PLATELET
Abs Immature Granulocytes: 0.03 10*3/uL (ref 0.00–0.07)
Basophils Absolute: 0 10*3/uL (ref 0.0–0.1)
Basophils Relative: 0 %
Eosinophils Absolute: 0.2 10*3/uL (ref 0.0–0.5)
Eosinophils Relative: 2 %
HCT: 40.8 % (ref 36.0–46.0)
Hemoglobin: 13.6 g/dL (ref 12.0–15.0)
Immature Granulocytes: 0 %
Lymphocytes Relative: 21 %
Lymphs Abs: 1.7 10*3/uL (ref 0.7–4.0)
MCH: 29.2 pg (ref 26.0–34.0)
MCHC: 33.3 g/dL (ref 30.0–36.0)
MCV: 87.6 fL (ref 80.0–100.0)
Monocytes Absolute: 0.4 10*3/uL (ref 0.1–1.0)
Monocytes Relative: 5 %
Neutro Abs: 5.6 10*3/uL (ref 1.7–7.7)
Neutrophils Relative %: 72 %
Platelets: 249 10*3/uL (ref 150–400)
RBC: 4.66 MIL/uL (ref 3.87–5.11)
RDW: 13.2 % (ref 11.5–15.5)
WBC: 7.9 10*3/uL (ref 4.0–10.5)
nRBC: 0 % (ref 0.0–0.2)

## 2020-05-12 MED ORDER — NICOTINE 21 MG/24HR TD PT24: 21.0000 mg | MEDICATED_PATCH | TRANSDERMAL | 0 refills | Status: AC

## 2020-05-12 NOTE — ED Provider Notes (Signed)
[facility] EMERGENCY DEPARTMENT Provider Note   CSN: [id number] Arrival date & time: 05/12/20  4132     History Chief Complaint  Patient presents with  . Abdominal Pain    [name] is a 54 y.o. female.  HPI   Patient with multiple year history of complaints of abdominal pain presents again because she said she had a crampy abdominal pain this morning after eating bacon and eggs.  She then had a bowel movement which she said was loose stool but without any sign of blood.  She said she came to the emergency department but it is since improved and she is only had a 3 out of 10 right now.  She does not have any significant abdominal tenderness, of note after her last emergency department visit she was sent to the [facility] gastroenterology clinic and did have an appointment with them.  There was documentation of an ova parasite stool test that was planned but I cannot seem to find results of it as well as multiple documentation of them trying to arrange a colonoscopy with her.  [place] so she has been unable to make that coordination.  She is still willing to get this done, of note she has been smoking a pack a day for roughly 35 years.  Says she has gotten screening chest CTs but there do not appear to be any recent ones in our system.  When asked if she is safe at home, she said that no one has been physically punching her but she is not sure if she is safe, she believes her family is plotting something against her.  When pressed for details she said that she knows that they are hiding money from her that she thinks could potentially be hers.  She often laughs at all times during the story and not all the details seem logical, she is alert and oriented x3 but there may be a psychological component to this.  She says that she does sometimes hear things but not instructions, she does sometimes have visions but is not having any currently.   Says she takes Seroquel regularly and sees a psychiatrist at [facility].  Has a follow-up appoint with them in a week or so  Past Medical History:  Diagnosis Date  . COPD (chronic obstructive pulmonary disease) ([place])   . Depression   . Diabetes mellitus without complication ([place])   . Hypertension     Patient Active Problem List   Diagnosis Date Noted  . Diarrhea 04/27/2019    Past Surgical History:  Procedure Laterality Date  . BLADDER REPAIR       OB History    Gravida      Para      Term      Preterm      AB      Living  0     SAB      TAB      Ectopic      Multiple      Live Births              Family History  Problem Relation Age of Onset  . Rheum arthritis Mother   . Congestive Heart Failure Father   . Hypertension Father     Social History   Tobacco Use  . Smoking status: Current Some Day Smoker    Packs/day: 1.00    Years: 33.00  Pack years: 33.00    Types: Cigarettes  . Smokeless tobacco: Never Used  Substance Use Topics  . Alcohol use: No  . Drug use: No    Home Medications Prior to Admission medications   Medication Sig Start Date End Date Taking? Authorizing Provider  metFORMIN (GLUCOPHAGE) 1000 MG tablet Take 1,000 mg by mouth 2 (two) times daily with a meal.   Yes [provider]  metoprolol tartrate (LOPRESSOR) 50 MG tablet Take 50 mg by mouth 2 (two) times daily.   Yes [provider]  albuterol (PROAIR HFA) 108 (90 Base) MCG/ACT inhaler Inhale 2 puffs into the lungs every 6 (six) hours as needed. Inhale 2 puffs into lungs every 4 to 6 hours as needed for wheezing    [provider]  amLODipine (NORVASC) 5 MG tablet Take 5 mg by mouth daily.    [provider]  ferrous sulfate 325 (65 FE) MG tablet Take 325 mg by mouth daily with breakfast.    [provider]  losartan-hydrochlorothiazide (HYZAAR) 100-12.5 MG tablet Take 1 tablet by mouth daily. 03/11/20   [provider]    Multiple Vitamins-Minerals (MULTIVITAMINS THER. W/MINERALS) TABS tablet Take 1 tablet by mouth daily.    [provider]  nicotine (NICODERM CQ - DOSED IN MG/24 HOURS) 21 mg/24hr patch Place 1 patch (21 mg total) onto the skin daily. 05/12/20 06/11/20  Marthenia Rolling, DO  sertraline (ZOLOFT) 100 MG tablet Take 200 mg by mouth at bedtime. 12/23/19   [provider]  SPIRIVA HANDIHALER 18 MCG inhalation capsule Place 1 capsule into inhaler and inhale daily. 11/16/19   [provider]  traZODone (DESYREL) 100 MG tablet Take 100 mg by mouth at bedtime. 03/01/20   [provider]  Vitamin D, Ergocalciferol, (DRISDOL) 50000 units CAPS capsule Take 1 capsule by mouth once a week. Takes on Monday    [provider]    Allergies    Penicillins  Review of Systems   Review of Systems  Constitutional: Negative.   HENT: Negative.   Eyes: Negative.   Respiratory: Negative.   Cardiovascular: Negative.   Gastrointestinal: Positive for abdominal pain and diarrhea. Negative for abdominal distention, anal bleeding, blood in stool, constipation, nausea, rectal pain and vomiting.  Genitourinary: Negative.   Musculoskeletal: Negative.   Skin: Negative.   Neurological: Negative.   Psychiatric/Behavioral: Positive for hallucinations and suicidal ideas. Negative for agitation, self-injury and sleep disturbance. The patient is nervous/anxious and is hyperactive.     Physical Exam Updated Vital Signs Temp 98.1 F (36.7 C) (Oral)   Ht 5\' 2"  (1.575 m)   Wt 77.1 kg   BMI 31.09 kg/m   Physical Exam Constitutional:      General: She is not in acute distress.    Appearance: She is well-developed. She is obese. She is not ill-appearing or diaphoretic.  HENT:     Head: Normocephalic.  Eyes:     General: No scleral icterus. Cardiovascular:     Rate and Rhythm: Normal rate and regular rhythm.     Heart sounds: No murmur.  Pulmonary:     Effort: Pulmonary effort is  normal. No respiratory distress.     Breath sounds: Normal breath sounds. No stridor. No wheezing, rhonchi or rales.  Abdominal:     General: Abdomen is flat. Bowel sounds are normal. There is no distension. There are no signs of injury.     Palpations: Abdomen is soft.     Tenderness: There is  no abdominal tenderness. There is no guarding or rebound. Negative signs include Murphy's sign.     Hernia: There is no hernia in the umbilical area or ventral area.  Neurological:     Mental Status: She is alert.     ED Results / Procedures / Treatments   Labs (all labs ordered are listed, but only abnormal results are displayed) Labs Reviewed  COMPREHENSIVE METABOLIC PANEL - Abnormal; Notable for the following components:      Result Value   Glucose, Bld 125 (*)    All other components within normal limits  CBC WITH DIFFERENTIAL/PLATELET    EKG None  Radiology No results found.  Procedures Procedures (including critical care time)  Medications Ordered in ED Medications - No data to display  ED Course  I have reviewed the triage vital signs and the nursing notes.  Pertinent labs & imaging results that were available during my care of the patient were reviewed by me and considered in my medical decision making (see chart for details).    MDM Rules/Calculators/A&P                      Abdominal pain resolved, outpatient follow-up with patient's prior GI has been scheduled.  She is currently following with psych at [facility] and is not currently having any hallucinations or SI HI.  We will prescribe some nicotine patches to help her with tobacco cessation and she plans to follow-up with her PCP at the health department for further prescriptions and a low-dose chest CT for lung cancer screening   Final Clinical Impression(s) / ED Diagnoses Final diagnoses:  Generalized abdominal pain  Tobacco abuse    Rx / DC Orders ED Discharge Orders         Ordered    nicotine (NICODERM CQ  - DOSED IN MG/24 HOURS) 21 mg/24hr patch  Every 24 hours     05/12/20 1054           Marthenia Rolling, DO 05/12/20 1101    Maia Plan, MD 05/15/20 [phone]

## 2020-05-12 NOTE — ED Triage Notes (Signed)
Pt reports generalized abd pain since yesterday.  Denies any n/v.  Reports has had diarrhea x 2 years.  Denies any urinary symptoms.  Denies any abnormal vaginal bleeding or discharge.  LBM was this morning.

## 2020-05-12 NOTE — Discharge Instructions (Addendum)
Make sure you go to your August 16 follow-up with a gastrointestinal doctor.  Call the health department to make sure you schedule your low-dose chest CT for lung cancer screening and get more nicotine patches.  Is important that you work on quitting smoking.  Please try to avoid drinking only caffeine and soda as this is unlikely to be helping your stomach problems.

## 2020-08-14 ENCOUNTER — Ambulatory Visit (INDEPENDENT_AMBULATORY_CARE_PROVIDER_SITE_OTHER): Payer: Medicaid Other | Admitting: Gastroenterology

## 2020-08-28 ENCOUNTER — Ambulatory Visit (INDEPENDENT_AMBULATORY_CARE_PROVIDER_SITE_OTHER): Payer: Medicaid Other | Admitting: Gastroenterology

## 2021-06-18 ENCOUNTER — Emergency Department (HOSPITAL_COMMUNITY): Payer: No Typology Code available for payment source

## 2021-06-18 ENCOUNTER — Emergency Department (HOSPITAL_COMMUNITY)
Admission: EM | Admit: 2021-06-18 | Discharge: 2021-06-19 | Disposition: A | Discharge status: Home / Self Care | Payer: No Typology Code available for payment source | Attending: Emergency Medicine | Admitting: Emergency Medicine

## 2021-06-18 ENCOUNTER — Encounter (HOSPITAL_COMMUNITY): Payer: Self-pay | Admitting: *Deleted

## 2021-06-18 ENCOUNTER — Other Ambulatory Visit: Payer: Self-pay

## 2021-06-18 DIAGNOSIS — R000 Tachycardia, unspecified: Secondary | ICD-10-CM | POA: Insufficient documentation

## 2021-06-18 DIAGNOSIS — Z046 Encounter for general psychiatric examination, requested by authority: Secondary | ICD-10-CM | POA: Diagnosis not present

## 2021-06-18 DIAGNOSIS — E119 Type 2 diabetes mellitus without complications: Secondary | ICD-10-CM | POA: Diagnosis not present

## 2021-06-18 DIAGNOSIS — R45851 Suicidal ideations: Secondary | ICD-10-CM | POA: Diagnosis not present

## 2021-06-18 DIAGNOSIS — R197 Diarrhea, unspecified: Secondary | ICD-10-CM | POA: Diagnosis present

## 2021-06-18 DIAGNOSIS — R109 Unspecified abdominal pain: Secondary | ICD-10-CM

## 2021-06-18 DIAGNOSIS — K8020 Calculus of gallbladder without cholecystitis without obstruction: Secondary | ICD-10-CM | POA: Insufficient documentation

## 2021-06-18 DIAGNOSIS — Z7984 Long term (current) use of oral hypoglycemic drugs: Secondary | ICD-10-CM | POA: Insufficient documentation

## 2021-06-18 DIAGNOSIS — Z79899 Other long term (current) drug therapy: Secondary | ICD-10-CM | POA: Diagnosis not present

## 2021-06-18 DIAGNOSIS — R1084 Generalized abdominal pain: Secondary | ICD-10-CM

## 2021-06-18 DIAGNOSIS — I10 Essential (primary) hypertension: Secondary | ICD-10-CM | POA: Diagnosis not present

## 2021-06-18 DIAGNOSIS — F32A Depression, unspecified: Secondary | ICD-10-CM | POA: Diagnosis present

## 2021-06-18 DIAGNOSIS — J449 Chronic obstructive pulmonary disease, unspecified: Secondary | ICD-10-CM | POA: Insufficient documentation

## 2021-06-18 DIAGNOSIS — F329 Major depressive disorder, single episode, unspecified: Secondary | ICD-10-CM | POA: Diagnosis not present

## 2021-06-18 DIAGNOSIS — F17210 Nicotine dependence, cigarettes, uncomplicated: Secondary | ICD-10-CM | POA: Diagnosis not present

## 2021-06-18 DIAGNOSIS — Z7951 Long term (current) use of inhaled steroids: Secondary | ICD-10-CM | POA: Insufficient documentation

## 2021-06-18 DIAGNOSIS — R443 Hallucinations, unspecified: Secondary | ICD-10-CM

## 2021-06-18 LAB — CBC WITH DIFFERENTIAL/PLATELET
Abs Immature Granulocytes: 0.03 10*3/uL (ref 0.00–0.07)
Basophils Absolute: 0 10*3/uL (ref 0.0–0.1)
Basophils Relative: 0 %
Eosinophils Absolute: 0.1 10*3/uL (ref 0.0–0.5)
Eosinophils Relative: 1 %
HCT: 44.5 % (ref 36.0–46.0)
Hemoglobin: 14.9 g/dL (ref 12.0–15.0)
Immature Granulocytes: 0 %
Lymphocytes Relative: 19 %
Lymphs Abs: 1.6 10*3/uL (ref 0.7–4.0)
MCH: 28.3 pg (ref 26.0–34.0)
MCHC: 33.5 g/dL (ref 30.0–36.0)
MCV: 84.6 fL (ref 80.0–100.0)
Monocytes Absolute: 0.5 10*3/uL (ref 0.1–1.0)
Monocytes Relative: 5 %
Neutro Abs: 6.5 10*3/uL (ref 1.7–7.7)
Neutrophils Relative %: 75 %
Platelets: 256 10*3/uL (ref 150–400)
RBC: 5.26 MIL/uL — ABNORMAL HIGH (ref 3.87–5.11)
RDW: 14 % (ref 11.5–15.5)
WBC: 8.7 10*3/uL (ref 4.0–10.5)
nRBC: 0 % (ref 0.0–0.2)

## 2021-06-18 LAB — LIPASE, BLOOD: Lipase: 42 U/L (ref 11–51)

## 2021-06-18 LAB — COMPREHENSIVE METABOLIC PANEL
ALT: 27 U/L (ref 0–44)
AST: 21 U/L (ref 15–41)
Albumin: 4.5 g/dL (ref 3.5–5.0)
Alkaline Phosphatase: 128 U/L — ABNORMAL HIGH (ref 38–126)
Anion gap: 11 (ref 5–15)
BUN: 11 mg/dL (ref 6–20)
CO2: 24 mmol/L (ref 22–32)
Calcium: 9.3 mg/dL (ref 8.9–10.3)
Chloride: 99 mmol/L (ref 98–111)
Creatinine, Ser: 0.75 mg/dL (ref 0.44–1.00)
GFR, Estimated: >60 mL/min (ref >60)
Glucose, Bld: 220 mg/dL — ABNORMAL HIGH (ref 70–99)
Potassium: 3.5 mmol/L (ref 3.5–5.1)
Sodium: 134 mmol/L — ABNORMAL LOW (ref 135–145)
Total Bilirubin: 0.7 mg/dL (ref 0.3–1.2)
Total Protein: 7.9 g/dL (ref 6.5–8.1)

## 2021-06-18 MED ORDER — DICYCLOMINE HCL 10 MG/ML IM SOLN
20.0000 mg | Freq: Once | INTRAMUSCULAR | Status: AC
  Administered 2021-06-18: 20 mg via INTRAMUSCULAR
  Filled 2021-06-18: qty 2

## 2021-06-18 MED ORDER — METOPROLOL TARTRATE 50 MG PO TABS
50.0000 mg | ORAL_TABLET | Freq: Two times a day (BID) | ORAL | Status: DC
  Administered 2021-06-19: 50 mg via ORAL
  Filled 2021-06-18: qty 1

## 2021-06-18 MED ORDER — STERILE WATER FOR INJECTION IJ SOLN
INTRAMUSCULAR | Status: AC
  Filled 2021-06-18: qty 10

## 2021-06-18 MED ORDER — SODIUM CHLORIDE 0.9 % IV BOLUS
1000.0000 mL | Freq: Once | INTRAVENOUS | Status: AC
  Administered 2021-06-18: 1000 mL via INTRAVENOUS

## 2021-06-18 MED ORDER — SODIUM CHLORIDE 0.9 % IV BOLUS: 1000.0000 mL | Freq: Once | INTRAVENOUS | Status: DC

## 2021-06-18 MED ORDER — ZOLPIDEM TARTRATE 5 MG PO TABS: 5.0000 mg | ORAL_TABLET | Freq: Every evening | ORAL | Status: DC | PRN

## 2021-06-18 MED ORDER — ACETAMINOPHEN 325 MG PO TABS: 650.0000 mg | ORAL_TABLET | ORAL | Status: DC | PRN

## 2021-06-18 MED ORDER — MORPHINE SULFATE (PF) 2 MG/ML IV SOLN: 2.0000 mg | Freq: Once | INTRAVENOUS | Status: DC

## 2021-06-18 MED ORDER — SUCRALFATE 1 G PO TABS
1.0000 g | ORAL_TABLET | Freq: Once | ORAL | Status: AC
  Administered 2021-06-18: 1 g via ORAL
  Filled 2021-06-18: qty 1

## 2021-06-18 MED ORDER — LORAZEPAM 2 MG/ML IJ SOLN: 0.5000 mg | Freq: Once | INTRAMUSCULAR | Status: DC

## 2021-06-18 MED ORDER — ZIPRASIDONE MESYLATE 20 MG IM SOLR
20.0000 mg | Freq: Once | INTRAMUSCULAR | Status: AC
  Administered 2021-06-18: 20 mg via INTRAMUSCULAR
  Filled 2021-06-18: qty 20

## 2021-06-18 NOTE — ED Notes (Signed)
Pt ran out of the hospital with IV in, Nina Alvine, MD has completed IVC paperwork, Charge RN contacted re: pt leaving, Airline pilot with the pt in the parking lot, pt smoking, police to bring pt back into the ED, PA aware of situation

## 2021-06-18 NOTE — ED Notes (Signed)
EDP made aware of pt at desk and yelling

## 2021-06-18 NOTE — ED Notes (Addendum)
PA in to speak with patient about request to leave. Patient verbalized that she had suicidal thoughts. SW elaborated about further concerns. PA states that he will IVC patient at this time. CN made aware and states to move patient to Hallway 8. When in room to move patient she is standing at sink trying to remove IV. Instructed to leave in place and that she would not be discharged at this time. Patient and SW moved to [facility].

## 2021-06-18 NOTE — ED Triage Notes (Signed)
Abdominal pain x 1 year

## 2021-06-18 NOTE — ED Provider Notes (Signed)
[facility] EMERGENCY DEPARTMENT Provider Note   CSN: [id number] Arrival date & time: 06/18/21  1447     History Chief Complaint  Patient presents with   Abdominal Pain    [name] is a 55 y.o. female.  HPI  Patient with significant medical history of COPD, depression, diabetes, hypertension, chronic abdominal pain presents to the emergency department with chief complaint of abdominal pain.  Patient states she has been having this pain intermittently for the last year, states the pain comes before she has a bowel movement and then feels better, she describes the pain as a cramping-like sensation all over her stomach, sometimes it radiates into her back.  She has occasional nausea and vomiting but has not had any recently.  She denies seeing dark tarry stools, blood in her stool, she denies hematemesis, hematuria.  She denies any alleviating factors, she has no history of connective tissue disorder or AAA.  Patient has no significant abdominal history, she admits to frequent NSAID use takes naproxen 3 times daily, she does smoke, denies alcohol use.  Patient denies systemic infection fevers or chills, denies sore throat cough, chest pain or shortness of breath.  Hallucinating paranoia suicidal ideation suicide  Past Medical History:  Diagnosis Date   COPD (chronic obstructive pulmonary disease) ([place])    Depression    Diabetes mellitus without complication ([place])    Hypertension     Patient Active Problem List   Diagnosis Date Noted   Diarrhea 04/27/2019    Past Surgical History:  Procedure Laterality Date   BLADDER REPAIR       OB History     Gravida      Para      Term      Preterm      AB      Living  0      SAB      IAB      Ectopic      Multiple      Live Births              Family History  Problem Relation Age of Onset   Rheum arthritis Mother    Congestive Heart Failure Father    Hypertension Father     Social History   Tobacco  Use   Smoking status: Some Days    Packs/day: 1.00    Years: 33.00    Pack years: 33.00    Types: Cigarettes   Smokeless tobacco: Never  Vaping Use   Vaping Use: Never used  Substance Use Topics   Alcohol use: No   Drug use: No    Home Medications Prior to Admission medications   Medication Sig Start Date End Date Taking? Authorizing Provider  albuterol (VENTOLIN HFA) 108 (90 Base) MCG/ACT inhaler Inhale 2 puffs into the lungs every 6 (six) hours as needed. Inhale 2 puffs into lungs every 4 to 6 hours as needed for wheezing   Yes [provider]  amLODipine (NORVASC) 5 MG tablet Take 5 mg by mouth daily.   Yes [provider]  Cholecalciferol (VITAMIN D) 125 MCG (5000 UT) CAPS Take 1 capsule by mouth daily.   Yes [provider]  ferrous sulfate 325 (65 FE) MG tablet Take 325 mg by mouth daily with breakfast.   Yes [provider]  losartan-hydrochlorothiazide (HYZAAR) 100-12.5 MG tablet Take 1 tablet by mouth daily. 03/11/20  Yes [provider]  metFORMIN (GLUCOPHAGE) 1000 MG tablet Take 1,000 mg by  mouth 2 (two) times daily with a meal.   Yes [provider]  metoprolol tartrate (LOPRESSOR) 50 MG tablet Take 50 mg by mouth 2 (two) times daily.   Yes [provider]  Multiple Vitamins-Minerals (MULTIVITAMINS THER. W/MINERALS) TABS tablet Take 1 tablet by mouth daily.   Yes [provider]  naproxen (NAPROSYN) 500 MG tablet Take 1,500 mg by mouth daily as needed for headache (body aches).   Yes [provider]  nicotine (NICODERM CQ - DOSED IN MG/24 HOURS) 21 mg/24hr patch Place 21 mg onto the skin daily as needed. 12/31/20  Yes [provider]  atorvastatin (LIPITOR) 20 MG tablet Take 1 tablet by mouth at bedtime. 06/05/21   [provider]  sertraline (ZOLOFT) 100 MG tablet Take 200 mg by mouth at bedtime. Patient not taking: Reported on 06/18/2021 12/23/19   [provider]  SPIRIVA  HANDIHALER 18 MCG inhalation capsule Place 1 capsule into inhaler and inhale daily. 11/16/19   [provider]  traZODone (DESYREL) 100 MG tablet Take 100 mg by mouth at bedtime. Patient not taking: Reported on 06/18/2021 03/01/20   [provider]  Vitamin D, Ergocalciferol, (DRISDOL) 50000 units CAPS capsule Take 1 capsule by mouth once a week. Takes on Monday Patient not taking: Reported on 06/18/2021    [provider]    Allergies    Penicillins  Review of Systems   Review of Systems  Constitutional:  Negative for chills and fever.  HENT:  Negative for congestion.   Respiratory:  Negative for shortness of breath.   Cardiovascular:  Negative for chest pain.  Gastrointestinal:  Positive for abdominal pain.  Genitourinary:  Negative for enuresis.  Musculoskeletal:  Negative for back pain.  Skin:  Negative for rash.  Neurological:  Negative for dizziness.  Hematological:  Does not bruise/bleed easily.   Physical Exam Updated Vital Signs BP (!) 153/114   Pulse (!) 112   Temp 98.9 F (37.2 C)   Resp 19   SpO2 99%   Physical Exam Vitals and nursing note reviewed.  Constitutional:      General: She is not in acute distress.    Appearance: She is not ill-appearing.  HENT:     Head: Normocephalic and atraumatic.     Nose: No congestion.  Eyes:     Conjunctiva/sclera: Conjunctivae normal.  Cardiovascular:     Rate and Rhythm: Regular rhythm. Tachycardia present.     Pulses: Normal pulses.     Heart sounds: No murmur heard.   No friction rub. No gallop.  Pulmonary:     Effort: No respiratory distress.     Breath sounds: No wheezing, rhonchi or rales.  Abdominal:     General: There is no distension.     Palpations: Abdomen is soft.     Tenderness: There is abdominal tenderness. There is no right CVA tenderness or left CVA tenderness.     Comments: Abdomen nondistended, normative bowel sounds, dull to percussion, she was tender to palpation in her  right upper quadrant, negative rebound tenderness, peritoneal sign, no guarding, or Murphy sign.  Musculoskeletal:     Right lower leg: No edema.     Left lower leg: No edema.  Skin:    General: Skin is warm and dry.  Neurological:     Mental Status: She is alert.  Psychiatric:        Mood and Affect: Mood normal.     Comments: During exam patient appeared to be  responding to internal stimuli laughing at inappropriate times, and constantly fidgeting with her upper lower extremities.    ED Results / Procedures / Treatments   Labs (all labs ordered are listed, but only abnormal results are displayed) Labs Reviewed  COMPREHENSIVE METABOLIC PANEL - Abnormal; Notable for the following components:      Result Value   Sodium 134 (*)    Glucose, Bld 220 (*)    Alkaline Phosphatase 128 (*)    All other components within normal limits  CBC WITH DIFFERENTIAL/PLATELET - Abnormal; Notable for the following components:   RBC 5.26 (*)    All other components within normal limits  RESP PANEL BY RT-PCR (FLU A&B, COVID) ARPGX2  LIPASE, BLOOD  ETHANOL  RAPID URINE DRUG SCREEN, HOSP PERFORMED  POC URINE PREG, ED    EKG EKG Interpretation  Date/Time:  Monday June 18 2021 15:52:00 EDT Ventricular Rate:  111 PR Interval:  168 QRS Duration: 99 QT Interval:  353 QTC Calculation: 480 R Axis:   -40 Text Interpretation: Sinus tachycardia Probable left atrial enlargement Left axis deviation RSR' in V1 or V2, right VCD or RVH Confirmed by Sherwood Gambler [phone]) on 06/18/2021 4:10:43 PM  Radiology CT ABDOMEN PELVIS WO CONTRAST  Result Date: 06/18/2021 CLINICAL DATA:  Abdomen pain EXAM: CT ABDOMEN AND PELVIS WITHOUT CONTRAST TECHNIQUE: Multidetector CT imaging of the abdomen and pelvis was performed following the standard protocol without IV contrast. COMPARISON:  Ultrasound 06/18/2021, CT 04/01/2019 FINDINGS: Lower chest: No acute abnormality. Hepatobiliary: Hepatic steatosis. Contracted gallbladder  filled with stones. No biliary dilatation Pancreas: Unremarkable. No pancreatic ductal dilatation or surrounding inflammatory changes. Spleen: Normal in size without focal abnormality. Adrenals/Urinary Tract: Right adrenal gland is normal. 11 mm left adrenal gland low-density nodule consistent with adenoma. Kidneys show no hydronephrosis. The bladder is normal Stomach/Bowel: Stomach is within normal limits. Appendix appears normal. No evidence of bowel wall thickening, distention, or inflammatory changes. Vascular/Lymphatic: Mild aortic atherosclerosis. No aneurysm. No suspicious nodes. Reproductive: Uterus and bilateral adnexa are unremarkable. Other: Negative for free air or free fluid. Musculoskeletal: No acute or significant osseous findings. IMPRESSION: 1. No CT evidence for acute intra-abdominal or pelvic abnormality. 2. Hepatic steatosis 3. Gallstones Electronically Signed   By: Donavan Foil M.D.   On: 06/18/2021 18:33   US Abdomen Limited  Result Date: 06/18/2021 CLINICAL DATA:  55 year old female with right upper quadrant abdominal pain. EXAM: ULTRASOUND ABDOMEN LIMITED RIGHT UPPER QUADRANT COMPARISON:  CT of the abdomen pelvis dated 07/17/2009. FINDINGS: Gallbladder: There is a 2 cm gallstone. The gallbladder is predominantly contracted. No gallbladder wall thickening or pericholecystic fluid. Negative sonographic Murphy's sign. Common bile duct: Diameter: 5 mm Liver: There is diffuse increased liver echogenicity most commonly seen in the setting of fatty infiltration. Superimposed inflammation or fibrosis is not excluded. Clinical correlation is recommended. Portal vein is patent on color Doppler imaging with normal direction of blood flow towards the liver. Other: None. IMPRESSION: 1. Cholelithiasis without sonographic evidence of acute cholecystitis. 2. Fatty liver. Electronically Signed   By: Anner Crete M.D.   On: 06/18/2021 16:51    Procedures Procedures   Medications Ordered in  ED Medications  acetaminophen (TYLENOL) tablet 650 mg (has no administration in time range)  zolpidem (AMBIEN) tablet 5 mg (has no administration in time range)  metoprolol tartrate (LOPRESSOR) tablet 50 mg (has no administration in time range)  sodium chloride 0.9 % bolus 1,000 mL (0 mLs Intravenous Stopped 06/18/21 1702)  dicyclomine (BENTYL) injection 20 mg (  20 mg Intramuscular Given 06/18/21 1545)  sucralfate (CARAFATE) tablet 1 g (1 g Oral Given 06/18/21 1546)  ziprasidone (GEODON) injection 20 mg (20 mg Intramuscular Given 06/18/21 1916)  sterile water (preservative free) injection (  Given 06/18/21 1916)    ED Course  I have reviewed the triage vital signs and the nursing notes.  Pertinent labs & imaging results that were available during my care of the patient were reviewed by me and considered in my medical decision making (see chart for details).    MDM Rules/Calculators/A&P                         Initial impression-patient presents with abdominal pain.  She is alert, does not appear to be in acute stress, vital signs show tachycardia.  Concern for possible gallbladder attack, will obtain basic lab work-up, provide patient fluids, pain medication, obtain ultrasound for further evaluation.  Work-up-CBC unremarkable, CMP shows hyponatremia 134, hyperglycemia 220, alk phos 128.  Lipase 42.  Limited ultrasound reveals cholelithiasis without evidence of acute cholecystitis fatty liver.  CT abdomen pelvis shows no acute abnormalities, hepatic aspergillosis gallstones.  Reassessment-reassessed the patient, after fluids and updated her on lab work and imaging, she continued to be tachycardic on my exam, she still had tenderness diffusely in her lower abdomen.  Recommend additional fluid, and CT abdomen pelvis for further evaluation.  Patient was agreement with this plan.  Notified by nursing staff that patient wanted to leave AMA, went to assess the patient, she endorsed that she did not want  to be here any longer.  She then endorsed she did not want to live any longer if she had to continue with this pain.  Patient states that she has had fleeting thoughts of ending her life, she has no plan at this time.  She denies hallucinations or delusions.    Social worker who has been following this patient endorse that patient is not acting herself, she states that she is not taking her medications for her depression and that her brother has concerns that she is going to hurt herself.    At this time I am concerned that patient is a danger to herself as she is showing signs of hallucinations, endorsing suicidal ideations, will IVC her.  Will obtain med clearance work-up, consult TTS and continue to monitor.  Patient reassessed mentating just fine in her room, vital signs remained stable.  We will continue to monitor and await TTS recommendations.  Consult-TTS pending   Rule out-low suspicion for intra-abdominal abscess as patient has no white count, vital signs reassuring, patient has low risk factors.  Low suspicion for liver or gallbladder abnormality as there is no elevation liver enzymes or alk phos, ultrasound is negative for acute findings.  Low suspicion for diverticulitis as there is no diverticulosis seen on CT scan.  Low suspicion for pancreatitis as lipase within normal limits.  Low suspicion for AAA as presentation is atypical, patient has low risk factors.  Patient is noted to be tachycardic but I suspect this is secondary due to anxiety as patient was extremely anxious while being here in the emergency department.  Plan-patient is medically cleared at this time, will order home meds, placed patient under psych hold, keep patient under IVC and await TTS recommendations.  Final Clinical Impression(s) / ED Diagnoses Final diagnoses:  None    Rx / DC Orders ED Discharge Orders     None  Marcello Fennel, PA-C 06/18/21 2106    Sherwood Gambler, MD 06/18/21  [phone]

## 2021-06-18 NOTE — ED Notes (Signed)
Security had to assist nurse to encourage patient to change into scrubs. Patient educated on IVC and agreed to belongings removed. Belongings locked up in locker in ED storage.

## 2021-06-18 NOTE — BH Assessment (Signed)
Disposition [name], NP, recommends overnight observation for safety and stabilization with psych reassessment in the AM. Dr. Lorin Picket and Jeanella Anton, RN, informed of disposition.
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
## 2021-06-18 NOTE — ED Notes (Signed)
Re Faxed paperwork to [phone]

## 2021-06-18 NOTE — ED Notes (Signed)
Pt behind nurse's station yelling and refusing to go back to room with attempt to redirect; pt yelling "what this involuntary commitment? I'm tired of yall's shit"; pt advised to return to room and eventually does so with security and PD on unit

## 2021-06-18 NOTE — ED Notes (Signed)
Pt refusing to get CT and refusing Ativan, this Rn to Musician

## 2021-06-18 NOTE — ED Notes (Signed)
Psychologist, educational

## 2021-06-18 NOTE — ED Notes (Signed)
Pt agreed to go to CT, this RN & security & a police officer accompanied the pt

## 2021-06-18 NOTE — ED Notes (Signed)
Patient asking to be discharged. PA aware.

## 2021-06-18 NOTE — ED Notes (Signed)
Faxed paperwork to [facility] [phone]

## 2021-06-19 DIAGNOSIS — R45851 Suicidal ideations: Secondary | ICD-10-CM

## 2021-06-19 DIAGNOSIS — R109 Unspecified abdominal pain: Secondary | ICD-10-CM

## 2021-06-19 NOTE — ED Notes (Signed)
Pt sitting in room, coloring and watching tv at this time.
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
## 2021-06-19 NOTE — ED Provider Notes (Signed)
Pt has been psych cleared.  She is d/c home with instructions to f/u with her pcp, GI, and [facility].  Return if worse.   Jacalyn Lefevre, MD 06/19/21 1119

## 2021-06-19 NOTE — ED Notes (Signed)
Rescinded IVC papers faxed to Magistrate. 

## 2021-06-19 NOTE — ED Notes (Signed)
Patient being assessed by [facility] NP at this time.

## 2021-06-19 NOTE — ED Notes (Signed)
Personal belongings provided to patient at this time. Brother on way to ED to pick up patient.

## 2021-06-19 NOTE — ED Notes (Signed)
Pt slept most of the night. Pt is alert and calm sitting in at chair at this time. Pt does not express any immediate needs. Will continue to monitor pt.

## 2021-10-04 ENCOUNTER — Ambulatory Visit (INDEPENDENT_AMBULATORY_CARE_PROVIDER_SITE_OTHER): Payer: Medicaid Other | Admitting: Gastroenterology

## 2021-10-04 ENCOUNTER — Other Ambulatory Visit: Payer: Self-pay

## 2021-10-04 ENCOUNTER — Encounter (INDEPENDENT_AMBULATORY_CARE_PROVIDER_SITE_OTHER): Payer: Self-pay | Admitting: Gastroenterology

## 2021-10-04 ENCOUNTER — Encounter (INDEPENDENT_AMBULATORY_CARE_PROVIDER_SITE_OTHER): Payer: Self-pay

## 2021-10-04 ENCOUNTER — Telehealth (INDEPENDENT_AMBULATORY_CARE_PROVIDER_SITE_OTHER): Payer: Self-pay

## 2021-10-04 VITALS — BP 134/76 | HR 80 | Temp 98.1°F | Ht 62.0 in | Wt 174.0 lb

## 2021-10-04 DIAGNOSIS — R140 Abdominal distension (gaseous): Secondary | ICD-10-CM | POA: Diagnosis not present

## 2021-10-04 DIAGNOSIS — K625 Hemorrhage of anus and rectum: Secondary | ICD-10-CM

## 2021-10-04 DIAGNOSIS — Z1211 Encounter for screening for malignant neoplasm of colon: Secondary | ICD-10-CM

## 2021-10-04 DIAGNOSIS — K760 Fatty (change of) liver, not elsewhere classified: Secondary | ICD-10-CM

## 2021-10-04 DIAGNOSIS — R197 Diarrhea, unspecified: Secondary | ICD-10-CM

## 2021-10-04 DIAGNOSIS — R5383 Other fatigue: Secondary | ICD-10-CM | POA: Diagnosis not present

## 2021-10-04 MED ORDER — PEG 3350-KCL-NA BICARB-NACL 420 G PO SOLR: 4000.0000 mL | ORAL | 0 refills | Status: AC

## 2021-10-04 MED ORDER — DICYCLOMINE HCL 10 MG PO CAPS: 10.0000 mg | ORAL_CAPSULE | Freq: Three times a day (TID) | ORAL | 3 refills | Status: AC

## 2021-10-04 NOTE — Telephone Encounter (Signed)
LeighAnn Anyla Israelson, CMA  

## 2021-10-04 NOTE — Patient Instructions (Signed)
We will check your blood count today for any anemia since you have had some worsening fatigue and rectal bleeding. We will also check stool studies to evaluate for any infectious causes of your diarrhea. I have sent bentyl (dicyclomine) to your pharmacy. You can take this up to [facility] for your abdominal pain.  We will get you scheduled for a colonoscopy/EGD as you have not had an initial screening colonoscopy and to further evaluate the GI symptoms you are having.  Please avoid NSAIDs (aleve, ibuprofen, naproxen, advil) as these can cause damage to your GI tract. You can use tylenol for pain as needed  Follow up in 3 months

## 2021-10-04 NOTE — Progress Notes (Signed)
Referring Provider: [facility]* Primary Care Physician:  [facility] Primary GI Physician: Rehman  Chief Complaint  Patient presents with   Hospitalization Follow-up    Hospital follow up. Discuss doing colonoscopy, feels tired all the time, diarrhea   HPI:   [name] is a 55 y.o. female with past medical history of COPD, Depression, DM, HTN.   Patient presenting today for hospital follow up after ED visit for abdominal pain 06/18/21. CT and Abd [place] at that time were unremarkable other than cholelithiasis, lipase was WNL as were LFTs. she was advised to follow up with GI for further evaluation and to schedule Colonoscopy as she has no prior colon cancer screening hx.  Patient states that she is continuing to have lower abdominal pain, with diarrhea daily, usually 2-3 episodes of diarrhea per day. She is unable to pinpoint exactly when her symptoms began but states it has been going on for atleast 4 months. Sometimes abdominal pain improves after having a BM. She states she has seen some bright red blood on occasion, both in the toilet and when she wiped. She reports some dark stools but she is on iron pills (ferrous sulfate 387m once daily). Reports if she does not take iron, stools are brown in color. She denies any issues with her appetite. She endorses some nausea as well, only one episode of vomiting a few days ago after breakfast. She endorses occasional post prandial abdominal pain. She denies weight loss. Denies constipation. She does endorse some fatigue. Last hemoglobin in June 2022 was normal at 14.9. She endorses occasional reflux which she takes pepto bismol or rolaids for as needed, these provide relief. She reports that she has frequent abdominal bloating, usually feels better with belching or passing flatus.   Fatty liver: as noted on recent [place] a/p in June during ER visit, last LFTs fairly unremarkable with only slightly  elevated Alk PHos at 128.  Social hUJ:WJXBJYetoh, 1 PPD cigarettes. Family hx: neg for CRC  Abd US:1. Cholelithiasis without sonographic evidence of acute Cholecystitis and Fatty liver.  CT A/P w/o: No CT evidence for acute intra-abdominal or pelvic abnormality. Hepatic steatosis  Gallstones  Last Colonoscopy:n/a Last Endoscopy:n/a  Recommendations:  Schedule EGD/colonoscopy  Past Medical History:  Diagnosis Date   COPD (chronic obstructive pulmonary disease) ([place]    Depression    Diabetes mellitus without complication [place]    Hypertension     Past Surgical History:  Procedure Laterality Date   BLADDER REPAIR      Current Outpatient Medications  Medication Sig Dispense Refill   albuterol (VENTOLIN HFA) 108 (90 Base) MCG/ACT inhaler Inhale 2 puffs into the lungs every 6 (six) hours as needed. Inhale 2 puffs into lungs every 4 to 6 hours as needed for wheezing     amLODipine (NORVASC) 5 MG tablet Take 5 mg by mouth daily.     atorvastatin (LIPITOR) 20 MG tablet Take 1 tablet by mouth at bedtime.     Cholecalciferol (VITAMIN D) 125 MCG (5000 UT) CAPS Take 1 capsule by mouth daily.     ferrous sulfate 325 (65 FE) MG tablet Take 325 mg by mouth daily with breakfast.     losartan-hydrochlorothiazide (HYZAAR) 100-12.5 MG tablet Take 1 tablet by mouth daily.     metFORMIN (GLUCOPHAGE) 1000 MG tablet Take 1,000 mg by mouth 2 (two) times daily with a meal.     metoprolol tartrate (LOPRESSOR) 50 MG  tablet Take 50 mg by mouth 2 (two) times daily.     Multiple Vitamins-Minerals (MULTIVITAMINS THER. W/MINERALS) TABS tablet Take 1 tablet by mouth daily.     naproxen (NAPROSYN) 500 MG tablet Take 1,500 mg by mouth daily as needed for headache (body aches).     nicotine (NICODERM CQ - DOSED IN MG/24 HOURS) 21 mg/24hr patch Place 21 mg onto the skin daily as needed.     sertraline (ZOLOFT) 100 MG tablet Take 200 mg by mouth at bedtime.     SPIRIVA HANDIHALER 18 MCG inhalation capsule  Place 1 capsule into inhaler and inhale daily.     traZODone (DESYREL) 100 MG tablet Take 100 mg by mouth at bedtime.     Vitamin D, Ergocalciferol, (DRISDOL) 50000 units CAPS capsule Take 1 capsule by mouth once a week. Takes on Monday     No current facility-administered medications for this visit.    Allergies as of 10/04/2021 - Review Complete 10/04/2021  Allergen Reaction Noted   Penicillins Rash 05/25/2016    Family History  Problem Relation Age of Onset   Rheum arthritis Mother    Congestive Heart Failure Father    Hypertension Father     Social History   Socioeconomic History   Marital status: Divorced    Spouse name: Not on file   Number of children: Not on file   Years of education: Not on file   Highest education level: Not on file  Occupational History   Not on file  Tobacco Use   Smoking status: Some Days    Packs/day: 1.00    Years: 33.00    Pack years: 33.00    Types: Cigarettes   Smokeless tobacco: Never  Vaping Use   Vaping Use: Never used  Substance and Sexual Activity   Alcohol use: No   Drug use: No   Sexual activity: Not on file  Other Topics Concern   Not on file  Social History Narrative   Not on file   Social Determinants of Health   Financial Resource Strain: Not on file  Food Insecurity: Not on file  Transportation Needs: Not on file  Physical Activity: Not on file  Stress: Not on file  Social Connections: Not on file    Review of Systems: Gen: Denies fever, chills, anorexia. Denies fatigue, weakness, weight loss.  CV: Denies chest pain, palpitations, syncope, peripheral edema, and claudication. Resp: Denies dyspnea at rest, cough, wheezing, coughing up blood, and pleurisy. GI: denies melena,constipation, dysphagia, odyonophagia, early satiety or weight loss. +diarrhea, +abdominal pain,+ bloating, +hematochezia +occasional reflux Derm: Denies rash, itching, dry skin Psych: Denies depression, anxiety, memory loss, confusion. No  homicidal or suicidal ideation.  Heme: Denies bruising, bleeding, and enlarged lymph nodes.  Physical Exam: BP 134/76 (BP Location: Left Arm, Patient Position: Sitting, Cuff Size: Normal)   Pulse 80   Temp 98.1 F (36.7 C) (Oral)   Ht _0  (1.575 m)   Wt 174 lb (78.9 kg)   BMI 31.83 kg/m  General:   Alert and oriented. No distress noted. Pleasant and cooperative.  Head:  Normocephalic and atraumatic. Eyes:  Conjuctiva clear without scleral icterus. Mouth:  Oral mucosa pink and moist. Good dentition. No lesions. Heart: Normal rate and rhythm, s1 and s2 heart sounds present.  Lungs: Clear lung sounds in all lobes. Respirations equal and unlabored. Abdomen:  +BS, soft, non-tender and non-distended. No rebound or guarding. No HSM or masses noted. Derm: No palmar erythema or jaundice Msk:  Symmetrical without gross deformities. Normal posture. Extremities:  Without edema. Neurologic:  Alert and  oriented x4 Psych:  Alert and cooperative. Normal mood and affect.  Invalid input(s): 6 MONTHS   ASSESSMENT: [name] is a 55 y.o. female presenting today for hospital follow up after she presented to the ER in June 2022 with abdominal pain and diarrhea.  Today she reports she is still experiencing abdominal pain/cramping and diarrhea, this has been ongoing for the past few months. She reports some occasional BRBPR, some dark stools when she is taking her iron pills compliantly, but stools are lighter brown if she does not take her iron. She endorses nausea, bloating and some post prandial abdominal pain. No constipation. One episode of vomiting a few days ago after breakfast. She has occasional reflux that she takes pepto bismol or rolaids for with good result. She denies dysphagia or odynophagia.she has not had any previous colonoscopies or EGDs. she denies any recent courses of abx or recent travel.  She is due for initial screening colonoscopy.We will check stool studies to r/o  infectious cause of diarrhea, we will also plan for EGD and colonoscopy for abdominal pain, bloating, post prandial pain, rectal bleeding. Start bentyl 57m TID prn for abdominal pain and cramping. will check a CBC as patient endorses some increased fatigue, last hgb in June was WNL, however, patient is currently on PO iron (no previous iron studies available for review). She denies any dizziness or lightheadedness.  She was also found to have fatty liver on abd [place] A/P in June 2022 during her ER visit, however, LFTs were WNL other than slightly elevated alk phos of 128. NAFLD score is -2.213, no liver elastography or fibrosure testing indicated at this time. Will recheck CMP today. She denies any alcohol use.   PLAN:  Stool studies, c diff, O&P, GI path panel 2. Bentyl 131mTID prn 3. Colonoscopy and EGD 4. Cbc and CMP   Indications, risks and benefits of procedure discussed in detail with patient. Patient verbalized understanding and is in agreement to proceed with EGD/Colonoscopy at this time.    Follow Up: 3 months  Tatem Fesler L. CaAlver SorrowMSN, APRN, AGNP-C Adult-Gerontology Nurse Practitioner [facility]

## 2021-10-05 ENCOUNTER — Encounter (INDEPENDENT_AMBULATORY_CARE_PROVIDER_SITE_OTHER): Payer: Self-pay

## 2021-10-05 LAB — COMPLETE METABOLIC PANEL WITH GFR
AG Ratio: 1.6 (calc) (ref 1.0–2.5)
ALT: 23 U/L (ref 6–29)
AST: 16 U/L (ref 10–35)
Albumin: 4.3 g/dL (ref 3.6–5.1)
Alkaline phosphatase (APISO): 125 U/L (ref 37–153)
BUN: 16 mg/dL (ref 7–25)
CO2: 22 mmol/L (ref 20–32)
Calcium: 10 mg/dL (ref 8.6–10.4)
Chloride: 101 mmol/L (ref 98–110)
Creat: 0.98 mg/dL (ref 0.50–1.03)
Globulin: 2.7 g/dL (calc) (ref 1.9–3.7)
Glucose, Bld: 276 mg/dL — ABNORMAL HIGH (ref 65–139)
Potassium: 4.9 mmol/L (ref 3.5–5.3)
Sodium: 138 mmol/L (ref 135–146)
Total Bilirubin: 0.6 mg/dL (ref 0.2–1.2)
Total Protein: 7 g/dL (ref 6.1–8.1)
eGFR: 69 mL/min/{1.73_m2} (ref >=60)

## 2021-10-05 LAB — CBC
HCT: 48.6 % — ABNORMAL HIGH (ref 35.0–45.0)
Hemoglobin: 15.5 g/dL (ref 11.7–15.5)
MCH: 27.6 pg (ref 27.0–33.0)
MCHC: 31.9 g/dL — ABNORMAL LOW (ref 32.0–36.0)
MCV: 86.5 fL (ref 80.0–100.0)
MPV: 10.9 fL (ref 7.5–12.5)
Platelets: 329 10*3/uL (ref 140–400)
RBC: 5.62 10*6/uL — ABNORMAL HIGH (ref 3.80–5.10)
RDW: 13.1 % (ref 11.0–15.0)
WBC: 10.8 10*3/uL (ref 3.8–10.8)

## 2021-10-15 ENCOUNTER — Ambulatory Visit (INDEPENDENT_AMBULATORY_CARE_PROVIDER_SITE_OTHER): Payer: Medicaid Other | Admitting: Gastroenterology

## 2021-10-19 ENCOUNTER — Encounter (HOSPITAL_COMMUNITY)
Admission: RE | Admit: 2021-10-19 | Discharge: 2021-10-19 | Disposition: A | Discharge status: Home / Self Care | Payer: Medicaid Other | Source: Ambulatory Visit | Attending: Gastroenterology | Admitting: Gastroenterology

## 2021-10-19 ENCOUNTER — Encounter (HOSPITAL_COMMUNITY): Payer: Self-pay

## 2021-10-19 NOTE — Patient Instructions (Signed)
   Your procedure is scheduled on: 10/23/2021  Report to [facility] at   7:15  AM.  Call this number if you have problems the morning of surgery: [phone]   Remember:              Follow Directions on the letter you received from Your Physician's office regarding the Bowel Prep              No Smoking the day of Procedure :   Take these medicines the morning of surgery with A SIP OF WATER: Amlodipine, zoloft and metoprolol  No diabetic medication am of procedure  Use inhalers if needed   Do not wear jewelry, make-up or nail polish.    Do not bring valuables to the hospital.  Contacts, dentures or bridgework may not be worn into surgery.  .   Patients discharged the day of surgery will not be allowed to drive home.     Colonoscopy, Adult, Care After This sheet gives you information about how to care for yourself after your procedure. Your health care provider may also give you more specific instructions. If you have problems or questions, contact your health care provider. What can I expect after the procedure? After the procedure, it is common to have: A small amount of blood in your stool for 24 hours after the procedure. Some gas. Mild abdominal cramping or bloating.  Follow these instructions at home: General instructions  For the first 24 hours after the procedure: Do not drive or use machinery. Do not sign important documents. Do not drink alcohol. Do your regular daily activities at a slower pace than normal. Eat soft, easy-to-digest foods. Rest often. Take over-the-counter or prescription medicines only as told by your health care provider. It is up to you to get the results of your procedure. Ask your health care provider, or the department performing the procedure, when your results will be ready. Relieving cramping and bloating Try walking around when you have cramps or feel bloated. Apply heat to your abdomen as told by your health care provider. Use a  heat source that your health care provider recommends, such as a moist heat pack or a heating pad. Place a towel between your skin and the heat source. Leave the heat on for 20-30 minutes. Remove the heat if your skin turns bright red. This is especially important if you are unable to feel pain, heat, or cold. You may have a greater risk of getting burned. Eating and drinking Drink enough fluid to keep your urine clear or pale yellow. Resume your normal diet as instructed by your health care provider. Avoid heavy or fried foods that are hard to digest. Avoid drinking alcohol for as long as instructed by your health care provider. Contact a health care provider if: You have blood in your stool 2-3 days after the procedure. Get help right away if: You have more than a small spotting of blood in your stool. You pass large blood clots in your stool. Your abdomen is swollen. You have nausea or vomiting. You have a fever. You have increasing abdominal pain that is not relieved with medicine. This information is not intended to replace advice given to you by your health care provider. Make sure you discuss any questions you have with your health care provider. Document Released: 07/30/2004 Document Revised: 09/09/2016 Document Reviewed: 02/27/2016 Elsevier Interactive Patient Education  [employer].

## 2021-10-23 ENCOUNTER — Encounter (HOSPITAL_COMMUNITY): Admission: RE | Payer: Self-pay | Source: Home / Self Care

## 2021-10-23 ENCOUNTER — Ambulatory Visit (HOSPITAL_COMMUNITY): Admission: RE | Admit: 2021-10-23 | Payer: Medicaid Other | Source: Home / Self Care | Admitting: Gastroenterology

## 2021-10-23 SURGERY — COLONOSCOPY WITH PROPOFOL
Anesthesia: Monitor Anesthesia Care

## 2021-11-16 ENCOUNTER — Emergency Department (HOSPITAL_COMMUNITY)
Admission: EM | Admit: 2021-11-16 | Discharge: 2021-11-16 | Discharge status: Against Medical Advice | Payer: Medicaid Other | Source: Home / Self Care

## 2021-12-17 ENCOUNTER — Encounter (INDEPENDENT_AMBULATORY_CARE_PROVIDER_SITE_OTHER): Payer: Self-pay | Admitting: *Deleted

## 2022-01-15 ENCOUNTER — Ambulatory Visit (INDEPENDENT_AMBULATORY_CARE_PROVIDER_SITE_OTHER): Payer: Medicaid Other | Admitting: Gastroenterology

## 2022-01-17 ENCOUNTER — Ambulatory Visit (INDEPENDENT_AMBULATORY_CARE_PROVIDER_SITE_OTHER): Payer: Medicaid Other | Admitting: Gastroenterology

## 2022-04-15 ENCOUNTER — Encounter (HOSPITAL_COMMUNITY): Payer: Self-pay

## 2022-04-15 ENCOUNTER — Other Ambulatory Visit: Payer: Self-pay

## 2022-04-15 ENCOUNTER — Emergency Department (HOSPITAL_COMMUNITY)
Admission: EM | Admit: 2022-04-15 | Discharge: 2022-04-15 | Disposition: A | Discharge status: Home / Self Care | Payer: Medicaid Other | Attending: Emergency Medicine | Admitting: Emergency Medicine

## 2022-04-15 DIAGNOSIS — Z7984 Long term (current) use of oral hypoglycemic drugs: Secondary | ICD-10-CM | POA: Diagnosis not present

## 2022-04-15 DIAGNOSIS — K5900 Constipation, unspecified: Secondary | ICD-10-CM

## 2022-04-15 DIAGNOSIS — G8929 Other chronic pain: Secondary | ICD-10-CM | POA: Diagnosis not present

## 2022-04-15 DIAGNOSIS — Z7951 Long term (current) use of inhaled steroids: Secondary | ICD-10-CM | POA: Insufficient documentation

## 2022-04-15 DIAGNOSIS — E119 Type 2 diabetes mellitus without complications: Secondary | ICD-10-CM | POA: Diagnosis not present

## 2022-04-15 DIAGNOSIS — J449 Chronic obstructive pulmonary disease, unspecified: Secondary | ICD-10-CM | POA: Diagnosis not present

## 2022-04-15 DIAGNOSIS — R109 Unspecified abdominal pain: Secondary | ICD-10-CM | POA: Diagnosis present

## 2022-04-15 LAB — COMPREHENSIVE METABOLIC PANEL
ALT: 23 U/L (ref 0–44)
AST: 17 U/L (ref 15–41)
Albumin: 3.8 g/dL (ref 3.5–5.0)
Alkaline Phosphatase: 105 U/L (ref 38–126)
Anion gap: 8 (ref 5–15)
BUN: 11 mg/dL (ref 6–20)
CO2: 26 mmol/L (ref 22–32)
Calcium: 8.9 mg/dL (ref 8.9–10.3)
Chloride: 104 mmol/L (ref 98–111)
Creatinine, Ser: 0.58 mg/dL (ref 0.44–1.00)
GFR, Estimated: >60 mL/min (ref >60)
Glucose, Bld: 271 mg/dL — ABNORMAL HIGH (ref 70–99)
Potassium: 3.5 mmol/L (ref 3.5–5.1)
Sodium: 138 mmol/L (ref 135–145)
Total Bilirubin: 0.5 mg/dL (ref 0.3–1.2)
Total Protein: 7.3 g/dL (ref 6.5–8.1)

## 2022-04-15 LAB — CBC
HCT: 42.4 % (ref 36.0–46.0)
Hemoglobin: 13.8 g/dL (ref 12.0–15.0)
MCH: 27.2 pg (ref 26.0–34.0)
MCHC: 32.5 g/dL (ref 30.0–36.0)
MCV: 83.6 fL (ref 80.0–100.0)
Platelets: 236 10*3/uL (ref 150–400)
RBC: 5.07 MIL/uL (ref 3.87–5.11)
RDW: 13.5 % (ref 11.5–15.5)
WBC: 7.8 10*3/uL (ref 4.0–10.5)
nRBC: 0 % (ref 0.0–0.2)

## 2022-04-15 LAB — URINALYSIS, ROUTINE W REFLEX MICROSCOPIC
Bilirubin Urine: NEGATIVE
Glucose, UA: 150 mg/dL — AB
Hgb urine dipstick: NEGATIVE
Ketones, ur: NEGATIVE mg/dL
Leukocytes,Ua: NEGATIVE
Nitrite: NEGATIVE
Protein, ur: NEGATIVE mg/dL
Specific Gravity, Urine: 1.016 (ref 1.005–1.030)
pH: 7 (ref 5.0–8.0)

## 2022-04-15 LAB — LIPASE, BLOOD: Lipase: 48 U/L (ref 11–51)

## 2022-04-15 LAB — RAPID URINE DRUG SCREEN, HOSP PERFORMED
Amphetamines: NOT DETECTED
Barbiturates: NOT DETECTED
Benzodiazepines: NOT DETECTED
Cocaine: NOT DETECTED
Opiates: NOT DETECTED
Tetrahydrocannabinol: NOT DETECTED

## 2022-04-15 LAB — ETHANOL: Alcohol, Ethyl (B): <10 mg/dL (ref <10)

## 2022-04-15 MED ORDER — POLYETHYLENE GLYCOL 3350 17 G PO PACK
17.0000 g | PACK | Freq: Once | ORAL | Status: AC
Start: 2022-04-15 — End: 2022-04-15
  Administered 2022-04-15: 17 g via ORAL
  Filled 2022-04-15: qty 1

## 2022-04-15 MED ORDER — MAGNESIUM CITRATE PO SOLN: 1.0000 | Freq: Once | ORAL | 0 refills | Status: AC

## 2022-04-15 NOTE — ED Triage Notes (Signed)
Pt reports generalized abd cramping and diarrhea off and on for "years."   Reports recently pain has gotten worse and says she stays tired all of the time.  Reports nausea and vomiting occasionally.  None recently.   ?

## 2022-04-15 NOTE — ED Provider Notes (Signed)
?[facility] EMERGENCY DEPARTMENT ?Provider Note ? ? ?CSN: [id number] ?Arrival date & time: 04/15/22  1013 ? ?  ? ?History ? ?Chief Complaint  ?Patient presents with  ? Abdominal Pain  ? V70.1  ? ? ?[name] is a 56 y.o. female. ? ?HPI ? ?  ? ?56 year old female with history of diabetes, depression, COPD comes in with chief complaint of abdominal pain. ? ?Patient indicates that she was supposed to see GI doctors earlier.  She missed her appointment.  She has been unable to schedule an appointment ever since.  Although she had a BM earlier today, she has had persistent abdominal cramping and episodes of constipation, prompting her to come to the ER for further assessment. ? ?She denies any abdominal surgical history.  She is concerned that she has some blockage or pathology of her colon. ? ?While in the triage, she indicated that if she does not get better, she might have to consider SI.  She has no plan, she has never tried to commit suicide. ? ?Home Medications ?Prior to Admission medications   ?Medication Sig Start Date End Date Taking? Authorizing Provider  ?magnesium citrate SOLN Take 296 mLs (1 Bottle total) by mouth once for 1 dose. 04/15/22 04/15/22 Yes Derwood KaplanNanavati, Breeona Waid, MD  ?albuterol (VENTOLIN HFA) 108 (90 Base) MCG/ACT inhaler Inhale 2 puffs into the lungs every 6 (six) hours as needed for shortness of breath or wheezing.    [provider]  ?amLODipine (NORVASC) 5 MG tablet Take 5 mg by mouth daily.    [provider]  ?atorvastatin (LIPITOR) 20 MG tablet Take 20 mg by mouth at bedtime. 06/05/21   [provider]  ?Cholecalciferol (VITAMIN D) 50 MCG (2000 UT) tablet Take 2,000 Units by mouth daily.    [provider]  ?dicyclomine (BENTYL) 10 MG capsule Take 1 capsule (10 mg total) by mouth 4 (four) times daily -  before meals and at bedtime. 10/04/21   Carlan, Chelsea L, NP  ?ferrous sulfate 325 (65 FE) MG tablet Take 325 mg by mouth daily with breakfast. ?Patient not  taking: Reported on 10/18/2021    [provider]  ?losartan-hydrochlorothiazide (HYZAAR) 100-12.5 MG tablet Take 1 tablet by mouth daily. 03/11/20   [provider]  ?metFORMIN (GLUCOPHAGE) 1000 MG tablet Take 1,000 mg by mouth 2 (two) times daily with a meal.    [provider]  ?metoprolol tartrate (LOPRESSOR) 50 MG tablet Take 50 mg by mouth 2 (two) times daily.    [provider]  ?Multiple Vitamins-Minerals (MULTIVITAMINS THER. W/MINERALS) TABS tablet Take 2 tablets by mouth daily.    [provider]  ?naproxen (NAPROSYN) 500 MG tablet Take 1,500 mg by mouth daily as needed for headache or moderate pain.    [provider]  ?polyethylene glycol-electrolytes (TRILYTE) 420 g solution Take 4,000 mLs by mouth as directed. 10/04/21   Dolores Frameastaneda Mayorga, Daniel, MD  ?risperiDONE (RISPERDAL) 0.5 MG tablet Take 0.5 mg by mouth at bedtime. 08/10/21   [provider]  ?sertraline (ZOLOFT) 100 MG tablet Take 200 mg by mouth in the morning. 12/23/19   [provider]  ?SPIRIVA HANDIHALER 18 MCG inhalation capsule Place 18 mcg into inhaler and inhale daily. 11/16/19   [provider]  ?traZODone (DESYREL) 100 MG tablet Take 100 mg by mouth at bedtime. 03/01/20   [provider]  ?   ? ?Allergies    ?Penicillins   ? ?Review of Systems   ?Review of Systems  ?  All other systems reviewed and are negative. ? ?Physical Exam ?Updated Vital Signs ?BP (!) 180/99 (BP Location: Right Arm)   Pulse (!) 102   Temp 98.9 ?F (37.2 ?C) (Oral)   Resp 18   Ht 5\' 2"  (1.575 m)   Wt 72.6 kg   SpO2 98%   BMI 29.26 kg/m?  ?Physical Exam ?Vitals and nursing note reviewed.  ?Constitutional:   ?   Appearance: She is well-developed.  ?HENT:  ?   Head: Atraumatic.  ?Cardiovascular:  ?   Rate and Rhythm: Normal rate.  ?Pulmonary:  ?   Effort: Pulmonary effort is normal.  ?Abdominal:  ?   Palpations: Abdomen is soft.  ?   Tenderness: There is no abdominal  tenderness.  ?Musculoskeletal:  ?   Cervical back: Normal range of motion and neck supple.  ?Skin: ?   General: Skin is warm and dry.  ?Neurological:  ?   Mental Status: She is alert and oriented to person, place, and time.  ? ? ?ED Results / Procedures / Treatments   ?Labs ?(all labs ordered are listed, but only abnormal results are displayed) ?Labs Reviewed  ?COMPREHENSIVE METABOLIC PANEL - Abnormal; Notable for the following components:  ?    Result Value  ? Glucose, Bld 271 (*)   ? All other components within normal limits  ?URINALYSIS, ROUTINE W REFLEX MICROSCOPIC - Abnormal; Notable for the following components:  ? Glucose, UA 150 (*)   ? All other components within normal limits  ?LIPASE, BLOOD  ?CBC  ?RAPID URINE DRUG SCREEN, HOSP PERFORMED  ?ETHANOL  ?POC URINE PREG, ED  ? ? ?EKG ?None ? ?Radiology ?No results found. ? ?Procedures ?Procedures  ? ? ?Medications Ordered in ED ?Medications  ?polyethylene glycol (MIRALAX / GLYCOLAX) packet 17 g (has no administration in time range)  ? ? ?ED Course/ Medical Decision Making/ A&P ?  ?                        ?Medical Decision Making ?Amount and/or Complexity of Data Reviewed ?Labs: ordered. ? ?Risk ?OTC drugs. ? ? ?This patient presents to the ED with chief complaint(s) of abdominal cramping with pertinent past medical history of diabetes, chronic abdominal pain  ? ?Upon patient's chart review, it appears that she had an appointment with GI on 09-2021 which was canceled.  Patient has not been able to establish an appointment since then.  She has had couple of ER visits with CT scans in the last year, which were negative for any acute findings. ? ?Patient does not have any significant abdominal tenderness on my exam. ? ?The differential diagnosis includes : ?Chronic abdominal pain, IBS, ileus.  No nausea, vomiting and patient is passing flatus, therefore doubt SBO.  Clinically patient does not have UTI.  ? ?The initial plan is to get basic labs and reassess the  patient.  If the labs are reassuring, then I do not think we need CT scan again. ? ?I have also consulted GI to see if he can establish a follow-up with them. ? ?Additional history obtained: ?Records reviewed  previous GI procedure appointment, CT scan in 2 to done within the last 1 year, which were negative for any acute process ? ? ?Treatment and Reassessment: ?Patient's blood work is normal.  Will not order CT scan. ? ?Patient had mentioned passively about her suicidal ideation.  On my evaluation, her primary complaint was abdominal pain.  I had to ask her  if she had SI, at which point she indicated that if she does not get better she might as well die.  She does not have a plan.  She does not appear to be an active danger.  She wants me to call GI for appointment -all of which is reassuring. ? ?Consultation: ?- Consulted or discussed management/test interpretation w/ external professional: GI team.  They will see the patient on June 1 ? ? ? ?Final Clinical Impression(s) / ED Diagnoses ?Final diagnoses:  ?Constipation, unspecified constipation type  ? ? ?Rx / DC Orders ?ED Discharge Orders   ? ?      Ordered  ?  magnesium citrate SOLN   Once       ? 04/15/22 1339  ? ?  ?  ? ?  ? ? ?  ?Derwood Kaplan, MD ?04/15/22 1347 ? ?

## 2022-04-15 NOTE — Discharge Instructions (Addendum)
We called GI team and they will be happy to see you on June 1.  That is the earliest appointment they can give you right now ? ?We gave you MiraLAX in the emergency room and prescribed you mag citrate, which can be taken only if your constipation remains. ?

## 2022-04-30 ENCOUNTER — Emergency Department (HOSPITAL_COMMUNITY): Payer: Medicaid Other

## 2022-04-30 ENCOUNTER — Other Ambulatory Visit: Payer: Self-pay

## 2022-04-30 ENCOUNTER — Emergency Department (HOSPITAL_COMMUNITY)
Admission: EM | Admit: 2022-04-30 | Discharge: 2022-04-30 | Disposition: A | Discharge status: Home / Self Care | Payer: Medicaid Other | Attending: Emergency Medicine | Admitting: Emergency Medicine

## 2022-04-30 ENCOUNTER — Encounter (HOSPITAL_COMMUNITY): Payer: Self-pay | Admitting: Emergency Medicine

## 2022-04-30 DIAGNOSIS — R1030 Lower abdominal pain, unspecified: Secondary | ICD-10-CM | POA: Diagnosis not present

## 2022-04-30 DIAGNOSIS — J441 Chronic obstructive pulmonary disease with (acute) exacerbation: Secondary | ICD-10-CM | POA: Insufficient documentation

## 2022-04-30 DIAGNOSIS — E119 Type 2 diabetes mellitus without complications: Secondary | ICD-10-CM | POA: Diagnosis not present

## 2022-04-30 DIAGNOSIS — R0602 Shortness of breath: Secondary | ICD-10-CM | POA: Diagnosis present

## 2022-04-30 DIAGNOSIS — Z7984 Long term (current) use of oral hypoglycemic drugs: Secondary | ICD-10-CM | POA: Diagnosis not present

## 2022-04-30 LAB — CBC WITH DIFFERENTIAL/PLATELET
Abs Immature Granulocytes: 0.02 10*3/uL (ref 0.00–0.07)
Basophils Absolute: 0 10*3/uL (ref 0.0–0.1)
Basophils Relative: 1 %
Eosinophils Absolute: 0.1 10*3/uL (ref 0.0–0.5)
Eosinophils Relative: 1 %
HCT: 45 % (ref 36.0–46.0)
Hemoglobin: 15.3 g/dL — ABNORMAL HIGH (ref 12.0–15.0)
Immature Granulocytes: 0 %
Lymphocytes Relative: 25 %
Lymphs Abs: 1.8 10*3/uL (ref 0.7–4.0)
MCH: 28 pg (ref 26.0–34.0)
MCHC: 34 g/dL (ref 30.0–36.0)
MCV: 82.3 fL (ref 80.0–100.0)
Monocytes Absolute: 0.4 10*3/uL (ref 0.1–1.0)
Monocytes Relative: 5 %
Neutro Abs: 5.1 10*3/uL (ref 1.7–7.7)
Neutrophils Relative %: 68 %
Platelets: 251 10*3/uL (ref 150–400)
RBC: 5.47 MIL/uL — ABNORMAL HIGH (ref 3.87–5.11)
RDW: 13.3 % (ref 11.5–15.5)
WBC: 7.4 10*3/uL (ref 4.0–10.5)
nRBC: 0 % (ref 0.0–0.2)

## 2022-04-30 LAB — COMPREHENSIVE METABOLIC PANEL
ALT: 23 U/L (ref 0–44)
AST: 17 U/L (ref 15–41)
Albumin: 4.3 g/dL (ref 3.5–5.0)
Alkaline Phosphatase: 117 U/L (ref 38–126)
Anion gap: 8 (ref 5–15)
BUN: 15 mg/dL (ref 6–20)
CO2: 26 mmol/L (ref 22–32)
Calcium: 9.3 mg/dL (ref 8.9–10.3)
Chloride: 102 mmol/L (ref 98–111)
Creatinine, Ser: 0.81 mg/dL (ref 0.44–1.00)
GFR, Estimated: >60 mL/min (ref >60)
Glucose, Bld: 351 mg/dL — ABNORMAL HIGH (ref 70–99)
Potassium: 3.9 mmol/L (ref 3.5–5.1)
Sodium: 136 mmol/L (ref 135–145)
Total Bilirubin: 0.4 mg/dL (ref 0.3–1.2)
Total Protein: 7.7 g/dL (ref 6.5–8.1)

## 2022-04-30 LAB — LIPASE, BLOOD: Lipase: 69 U/L — ABNORMAL HIGH (ref 11–51)

## 2022-04-30 MED ORDER — ALBUTEROL SULFATE (2.5 MG/3ML) 0.083% IN NEBU
2.5000 mg | INHALATION_SOLUTION | Freq: Once | RESPIRATORY_TRACT | Status: AC
  Administered 2022-04-30: 2.5 mg via RESPIRATORY_TRACT
  Filled 2022-04-30: qty 3

## 2022-04-30 MED ORDER — IOHEXOL 300 MG/ML  SOLN
100.0000 mL | Freq: Once | INTRAMUSCULAR | Status: AC | PRN
  Administered 2022-04-30: 100 mL via INTRAVENOUS

## 2022-04-30 MED ORDER — IPRATROPIUM-ALBUTEROL 0.5-2.5 (3) MG/3ML IN SOLN
3.0000 mL | Freq: Once | RESPIRATORY_TRACT | Status: AC
Start: 2022-04-30 — End: 2022-04-30
  Administered 2022-04-30: 3 mL via RESPIRATORY_TRACT
  Filled 2022-04-30: qty 3

## 2022-04-30 MED ORDER — MAGNESIUM SULFATE 2 GM/50ML IV SOLN
2.0000 g | Freq: Once | INTRAVENOUS | Status: AC
  Administered 2022-04-30: 2 g via INTRAVENOUS
  Filled 2022-04-30: qty 50

## 2022-04-30 MED ORDER — METHYLPREDNISOLONE SODIUM SUCC 125 MG IJ SOLR
125.0000 mg | Freq: Once | INTRAMUSCULAR | Status: AC
  Administered 2022-04-30: 125 mg via INTRAVENOUS
  Filled 2022-04-30: qty 2

## 2022-04-30 NOTE — ED Notes (Signed)
Pt called out reporting intermittent abdominal cramping and intermittent n/v/d "for a while."  Pt is asking to be worked up regarding these issues as well. ?

## 2022-04-30 NOTE — ED Provider Notes (Signed)
?[facility] ?Provider Note ? ? ?CSN: [id number] ?Arrival date & time: 04/30/22  0719 ? ?  ? ?History ? ?Chief Complaint  ?Patient presents with  ? Shortness of Breath  ? ? ?[name] is a 56 y.o. female. ? ?Patient complains of some shortness of breath and she has been having abdominal discomfort off and on for a number of weeks.  Patient has a history of COPD and she is seeing GI and is scheduled for colonoscopy ? ?The history is provided by the patient and medical records. No language interpreter was used.  ?Shortness of Breath ?Severity:  Moderate ?Onset quality:  Sudden ?Timing:  Intermittent ?Progression:  Waxing and waning ?Chronicity:  Recurrent ?Context: activity   ?Relieved by:  Nothing ?Worsened by:  Nothing ?Ineffective treatments:  None tried ?Associated symptoms: wheezing   ?Associated symptoms: no abdominal pain, no chest pain, no cough, no headaches and no rash   ? ?  ? ?Home Medications ?Prior to Admission medications   ?Medication Sig Start Date End Date Taking? Authorizing Provider  ?albuterol (VENTOLIN HFA) 108 (90 Base) MCG/ACT inhaler Inhale 2 puffs into the lungs every 6 (six) hours as needed for shortness of breath or wheezing.    [provider]  ?amLODipine (NORVASC) 5 MG tablet Take 5 mg by mouth daily.    [provider]  ?atorvastatin (LIPITOR) 20 MG tablet Take 20 mg by mouth at bedtime. 06/05/21   [provider]  ?Cholecalciferol (VITAMIN D) 50 MCG (2000 UT) tablet Take 2,000 Units by mouth daily.    [provider]  ?dicyclomine (BENTYL) 10 MG capsule Take 1 capsule (10 mg total) by mouth 4 (four) times daily -  before meals and at bedtime. 10/04/21   Carlan, Chelsea L, NP  ?ferrous sulfate 325 (65 FE) MG tablet Take 325 mg by mouth daily with breakfast. ?Patient not taking: Reported on 10/18/2021    [provider]  ?losartan-hydrochlorothiazide (HYZAAR) 100-12.5 MG tablet Take 1 tablet by mouth daily. 03/11/20    [provider]  ?metFORMIN (GLUCOPHAGE) 1000 MG tablet Take 1,000 mg by mouth 2 (two) times daily with a meal.    [provider]  ?metoprolol tartrate (LOPRESSOR) 50 MG tablet Take 50 mg by mouth 2 (two) times daily.    [provider]  ?Multiple Vitamins-Minerals (MULTIVITAMINS THER. W/MINERALS) TABS tablet Take 2 tablets by mouth daily.    [provider]  ?naproxen (NAPROSYN) 500 MG tablet Take 1,500 mg by mouth daily as needed for headache or moderate pain.    [provider]  ?polyethylene glycol-electrolytes (TRILYTE) 420 g solution Take 4,000 mLs by mouth as directed. 10/04/21   Dolores Frame, MD  ?risperiDONE (RISPERDAL) 0.5 MG tablet Take 0.5 mg by mouth at bedtime. 08/10/21   [provider]  ?sertraline (ZOLOFT) 100 MG tablet Take 200 mg by mouth in the morning. 12/23/19   [provider]  ?SPIRIVA HANDIHALER 18 MCG inhalation capsule Place 18 mcg into inhaler and inhale daily. 11/16/19   [provider]  ?traZODone (DESYREL) 100 MG tablet Take 100 mg by mouth at bedtime. 03/01/20   [provider]  ?   ? ?Allergies    ?Penicillins   ? ?Review of Systems   ?Review of Systems  ?Constitutional:  Negative for appetite change and fatigue.  ?HENT:  Negative for congestion, ear discharge and sinus pressure.   ?Eyes:  Negative for discharge.  ?Respiratory:  Positive for shortness of breath  and wheezing. Negative for cough.   ?Cardiovascular:  Negative for chest pain.  ?Gastrointestinal:  Negative for abdominal pain and diarrhea.  ?Genitourinary:  Negative for frequency and hematuria.  ?Musculoskeletal:  Negative for back pain.  ?Skin:  Negative for rash.  ?Neurological:  Negative for seizures and headaches.  ?Psychiatric/Behavioral:  Negative for hallucinations.   ? ?Physical Exam ?Updated Vital Signs ?BP (!) 153/121   Pulse (!) 58   Temp 97.9 ?F (36.6 ?C) (Oral)   Resp 20   Ht 5\' 2"  (1.575 m)   Wt 75.6 kg   SpO2 94%    BMI 30.47 kg/m?  ?Physical Exam ?Vitals and nursing note reviewed.  ?Constitutional:   ?   Appearance: She is well-developed.  ?HENT:  ?   Head: Normocephalic.  ?Eyes:  ?   General: No scleral icterus. ?   Conjunctiva/sclera: Conjunctivae normal.  ?Neck:  ?   Thyroid: No thyromegaly.  ?Cardiovascular:  ?   Rate and Rhythm: Normal rate and regular rhythm.  ?   Heart sounds: No murmur heard. ?  No friction rub. No gallop.  ?Pulmonary:  ?   Breath sounds: No stridor. Wheezing present. No rales.  ?Chest:  ?   Chest wall: No tenderness.  ?Abdominal:  ?   General: There is no distension.  ?   Tenderness: There is no abdominal tenderness. There is no rebound.  ?Musculoskeletal:     ?   General: Normal range of motion.  ?   Cervical back: Neck supple.  ?Lymphadenopathy:  ?   Cervical: No cervical adenopathy.  ?Skin: ?   Findings: No erythema or rash.  ?Neurological:  ?   Mental Status: She is alert and oriented to person, place, and time.  ?   Motor: No abnormal muscle tone.  ?   Coordination: Coordination normal.  ?Psychiatric:     ?   Behavior: Behavior normal.  ? ? ?ED Results / Procedures / Treatments   ?Labs ?(all labs ordered are listed, but only abnormal results are displayed) ?Labs Reviewed  ?CBC WITH DIFFERENTIAL/PLATELET - Abnormal; Notable for the following components:  ?    Result Value  ? RBC 5.47 (*)   ? Hemoglobin 15.3 (*)   ? All other components within normal limits  ?COMPREHENSIVE METABOLIC PANEL - Abnormal; Notable for the following components:  ? Glucose, Bld 351 (*)   ? All other components within normal limits  ?LIPASE, BLOOD - Abnormal; Notable for the following components:  ? Lipase 69 (*)   ? All other components within normal limits  ? ? ?EKG ?None ? ?Radiology ?CT ABDOMEN PELVIS W CONTRAST ? ?Result Date: 04/30/2022 ?CLINICAL DATA:  Intermittent abdominal pain with nausea, vomiting, and diarrhea for several weeks. EXAM: CT ABDOMEN AND PELVIS WITH CONTRAST TECHNIQUE: Multidetector CT imaging of  the abdomen and pelvis was performed using the standard protocol following bolus administration of intravenous contrast. RADIATION DOSE REDUCTION: This exam was performed according to the departmental dose-optimization program which includes automated exposure control, adjustment of the mA and/or kV according to patient size and/or use of iterative reconstruction technique. CONTRAST:  100mL OMNIPAQUE IOHEXOL 300 MG/ML  SOLN COMPARISON:  CT abdomen pelvis dated June 18, 2021. FINDINGS: Lower chest: No acute abnormality. Hepatobiliary: Unchanged diffusely decreased liver density without focal abnormality. Unchanged gallstones. No gallbladder wall thickening or biliary dilatation. Pancreas: Unremarkable. No pancreatic ductal dilatation or surrounding inflammatory changes. Spleen: Normal in size without focal abnormality. Adrenals/Urinary Tract: Unchanged 1.1 cm left adrenal nodule, previously  characterized as a benign adenoma on prior noncontrast study. No follow-up imaging is recommended. The right adrenal gland is normal. Kidneys are normal without renal calculi, solid lesion, or hydronephrosis. Bladder is unremarkable. Stomach/Bowel: Stomach is within normal limits. Appendix appears normal. No evidence of bowel wall thickening, distention, or inflammatory changes. Vascular/Lymphatic: Aortic atherosclerosis. No enlarged abdominal or pelvic lymph nodes. Reproductive: Uterus and bilateral adnexa are unremarkable. Other: No abdominal wall hernia or abnormality. No abdominopelvic ascites. No pneumoperitoneum. Musculoskeletal: No acute or significant osseous findings. Advanced right hip osteoarthritis again noted. IMPRESSION: 1. No acute intra-abdominal process. 2. Unchanged hepatic steatosis and cholelithiasis. 3. Aortic Atherosclerosis (ICD10-I70.0). Electronically Signed   By: Obie Dredge M.D.   On: 04/30/2022 10:19  ? ?DG Chest Port 1 View ? ?Result Date: 04/30/2022 ?CLINICAL DATA:  Increased shortness of breath  this morning with some cough EXAM: PORTABLE CHEST - 1 VIEW COMPARISON:  08/30/2014 FINDINGS: Cardiomediastinal silhouette and pulmonary vasculature are within normal limits. Lungs are clear. IMPRESSION: No acute car

## 2022-04-30 NOTE — ED Notes (Signed)
ED Provider at bedside. 

## 2022-04-30 NOTE — ED Triage Notes (Signed)
Pt reports shortness of breath that started this morning. Pt denies pain and ambulated to the room. The pt has inspiratory wheezing and a history of COPD. ?

## 2022-04-30 NOTE — ED Notes (Signed)
Pt reports breathing much better after medications.  ?

## 2022-04-30 NOTE — ED Notes (Signed)
Pt no longer in room when this writer entered to complete d/c.  No waste from IV noted in bed or in trash can.  All belongings gone from room. ? ?This writer found the Pt walking to her car and confirmed she had removed her IV.  D/c paperwork provided.  ?

## 2022-04-30 NOTE — ED Notes (Signed)
Patient transported to CT 

## 2022-04-30 NOTE — ED Notes (Signed)
Pt reports SOB and cough w/ small amount of yellow sputum starting this morning.  Pt is very antsy/restless in bed.   ?

## 2022-04-30 NOTE — ED Notes (Signed)
Lab made aware of Lipase order.  ?

## 2022-04-30 NOTE — Discharge Instructions (Addendum)
Continue using your inhalers.  Follow-up with the stomach doctor as planned and also follow-up with your family doctor for the shortness of breath ?

## 2022-05-30 ENCOUNTER — Encounter (INDEPENDENT_AMBULATORY_CARE_PROVIDER_SITE_OTHER): Payer: Self-pay | Admitting: Gastroenterology

## 2022-05-30 ENCOUNTER — Ambulatory Visit (INDEPENDENT_AMBULATORY_CARE_PROVIDER_SITE_OTHER): Payer: Medicaid Other | Admitting: Gastroenterology

## 2022-07-30 DEATH — deceased
